# Patient Record
Sex: Male | Born: 1975 | Race: White | Hispanic: No | Marital: Married | Smoking: Never smoker
Health system: Southern US, Community
[De-identification: ages and names within clinical notes are randomized; demographics above are authoritative.]

## PROBLEM LIST (undated history)

## (undated) DIAGNOSIS — I10 Essential (primary) hypertension: Secondary | ICD-10-CM

## (undated) DIAGNOSIS — T7840XA Allergy, unspecified, initial encounter: Secondary | ICD-10-CM

## (undated) DIAGNOSIS — E119 Type 2 diabetes mellitus without complications: Secondary | ICD-10-CM

## (undated) HISTORY — PX: FRACTURE SURGERY: SHX138

## (undated) HISTORY — DX: Allergy, unspecified, initial encounter: T78.40XA

---

## 1995-05-19 HISTORY — PX: FACIAL RECONSTRUCTION SURGERY: SHX631

## 2007-04-27 ENCOUNTER — Emergency Department (HOSPITAL_COMMUNITY): Admission: EM | Admit: 2007-04-27 | Discharge: 2007-04-27 | Payer: Self-pay | Admitting: Emergency Medicine

## 2008-06-24 ENCOUNTER — Emergency Department (HOSPITAL_COMMUNITY): Admission: EM | Admit: 2008-06-24 | Discharge: 2008-06-24 | Payer: Self-pay | Admitting: Emergency Medicine

## 2008-08-06 ENCOUNTER — Encounter: Admission: RE | Admit: 2008-08-06 | Discharge: 2008-08-06 | Payer: Self-pay | Admitting: Otolaryngology

## 2010-09-02 LAB — CULTURE, BLOOD (ROUTINE X 2)
Culture: NO GROWTH
Culture: NO GROWTH

## 2010-09-02 LAB — POCT I-STAT, CHEM 8
BUN: 17 mg/dL (ref 6–23)
Calcium, Ion: 1.15 mmol/L (ref 1.12–1.32)
HCT: 51 % (ref 39.0–52.0)
Hemoglobin: 17.3 g/dL — ABNORMAL HIGH (ref 13.0–17.0)
Potassium: 3.9 mEq/L (ref 3.5–5.1)
Sodium: 141 mEq/L (ref 135–145)
TCO2: 25 mmol/L (ref 0–100)

## 2010-09-02 LAB — DIFFERENTIAL
Basophils Relative: 0 % (ref 0–1)
Eosinophils Absolute: 0 10*3/uL (ref 0.0–0.7)
Lymphs Abs: 1 10*3/uL (ref 0.7–4.0)
Monocytes Relative: 2 % — ABNORMAL LOW (ref 3–12)

## 2010-09-02 LAB — CBC
Hemoglobin: 16.3 g/dL (ref 13.0–17.0)
MCV: 89.7 fL (ref 78.0–100.0)
Platelets: 256 10*3/uL (ref 150–400)
WBC: 11.3 10*3/uL — ABNORMAL HIGH (ref 4.0–10.5)

## 2011-02-23 LAB — URINALYSIS, ROUTINE W REFLEX MICROSCOPIC
Nitrite: NEGATIVE
Specific Gravity, Urine: 1.025
Urobilinogen, UA: 0.2
pH: 7

## 2016-07-21 DIAGNOSIS — E669 Obesity, unspecified: Secondary | ICD-10-CM | POA: Insufficient documentation

## 2016-08-13 MED FILL — CEPHALEXIN 500 MG CAPSULE: 500 | 30 days supply | Qty: 60 | Fill #0

## 2016-08-13 MED FILL — CLINDAMYCIN PH 1% GEL: 1 | 20 days supply | Qty: 30 | Fill #0 | Status: TO

## 2018-06-28 DIAGNOSIS — L719 Rosacea, unspecified: Secondary | ICD-10-CM | POA: Insufficient documentation

## 2019-02-17 ENCOUNTER — Encounter (HOSPITAL_COMMUNITY): Payer: Self-pay | Admitting: Emergency Medicine

## 2019-02-17 ENCOUNTER — Emergency Department (HOSPITAL_COMMUNITY): Payer: 59

## 2019-02-17 ENCOUNTER — Emergency Department (HOSPITAL_COMMUNITY)
Admission: EM | Admit: 2019-02-17 | Discharge: 2019-02-17 | Disposition: A | Discharge status: Home / Self Care | Payer: 59 | Attending: Emergency Medicine | Admitting: Emergency Medicine

## 2019-02-17 DIAGNOSIS — I10 Essential (primary) hypertension: Secondary | ICD-10-CM | POA: Diagnosis not present

## 2019-02-17 DIAGNOSIS — M62830 Muscle spasm of back: Secondary | ICD-10-CM

## 2019-02-17 DIAGNOSIS — Z7984 Long term (current) use of oral hypoglycemic drugs: Secondary | ICD-10-CM | POA: Insufficient documentation

## 2019-02-17 DIAGNOSIS — Z79899 Other long term (current) drug therapy: Secondary | ICD-10-CM | POA: Diagnosis not present

## 2019-02-17 DIAGNOSIS — E119 Type 2 diabetes mellitus without complications: Secondary | ICD-10-CM | POA: Insufficient documentation

## 2019-02-17 DIAGNOSIS — M549 Dorsalgia, unspecified: Secondary | ICD-10-CM | POA: Diagnosis present

## 2019-02-17 HISTORY — DX: Essential (primary) hypertension: I10

## 2019-02-17 HISTORY — DX: Type 2 diabetes mellitus without complications: E11.9

## 2019-02-17 LAB — BASIC METABOLIC PANEL
Anion gap: 7 (ref 5–15)
BUN: 14 mg/dL (ref 6–20)
CO2: 26 mmol/L (ref 22–32)
Calcium: 8.6 mg/dL — ABNORMAL LOW (ref 8.9–10.3)
Chloride: 107 mmol/L (ref 98–111)
Creatinine, Ser: 0.91 mg/dL (ref 0.61–1.24)
GFR calc Af Amer: >60 mL/min (ref >60)
GFR calc non Af Amer: >60 mL/min (ref >60)
Glucose, Bld: 121 mg/dL — ABNORMAL HIGH (ref 70–99)
Potassium: 3.7 mmol/L (ref 3.5–5.1)
Sodium: 140 mmol/L (ref 135–145)

## 2019-02-17 LAB — CBC WITH DIFFERENTIAL/PLATELET
Abs Immature Granulocytes: 0.04 10*3/uL (ref 0.00–0.07)
Basophils Absolute: 0 10*3/uL (ref 0.0–0.1)
Basophils Relative: 0 %
Eosinophils Absolute: 0 10*3/uL (ref 0.0–0.5)
Eosinophils Relative: 0 %
HCT: 44.2 % (ref 39.0–52.0)
Hemoglobin: 14.4 g/dL (ref 13.0–17.0)
Immature Granulocytes: 1 %
Lymphocytes Relative: 14 %
Lymphs Abs: 1.1 10*3/uL (ref 0.7–4.0)
MCH: 29.8 pg (ref 26.0–34.0)
MCHC: 32.6 g/dL (ref 30.0–36.0)
MCV: 91.3 fL (ref 80.0–100.0)
Monocytes Absolute: 0.4 10*3/uL (ref 0.1–1.0)
Monocytes Relative: 5 %
Neutro Abs: 6.5 10*3/uL (ref 1.7–7.7)
Neutrophils Relative %: 80 %
Platelets: 199 10*3/uL (ref 150–400)
RBC: 4.84 MIL/uL (ref 4.22–5.81)
RDW: 12.6 % (ref 11.5–15.5)
WBC: 8.1 10*3/uL (ref 4.0–10.5)
nRBC: 0 % (ref 0.0–0.2)

## 2019-02-17 MED ORDER — IBUPROFEN 600 MG PO TABS: 600.0000 mg | ORAL_TABLET | Freq: Three times a day (TID) | ORAL | 0 refills | Status: DC | PRN

## 2019-02-17 MED ORDER — CYCLOBENZAPRINE HCL 10 MG PO TABS: 10.0000 mg | ORAL_TABLET | Freq: Two times a day (BID) | ORAL | 0 refills | Status: DC | PRN

## 2019-02-17 MED ORDER — METHYLPREDNISOLONE 4 MG PO TBPK: ORAL_TABLET | ORAL | 0 refills | Status: DC

## 2019-02-17 MED ORDER — DEXAMETHASONE SODIUM PHOSPHATE 10 MG/ML IJ SOLN
8.0000 mg | Freq: Once | INTRAMUSCULAR | Status: AC
  Administered 2019-02-17: 8 mg via INTRAVENOUS
  Filled 2019-02-17: qty 1

## 2019-02-17 MED ORDER — MORPHINE SULFATE (PF) 4 MG/ML IV SOLN
4.0000 mg | Freq: Once | INTRAVENOUS | Status: AC
  Administered 2019-02-17: 18:00:00 4 mg via INTRAVENOUS
  Filled 2019-02-17: qty 1

## 2019-02-17 NOTE — ED Notes (Signed)
Pt was verbalized discharge instructions. Pt had no further questions at this time. NAD. 

## 2019-02-17 NOTE — ED Provider Notes (Signed)
[facility]-EMERGENCY DEPT Provider Note   CSN: [id number] Arrival date & time: 02/17/19  1526     History   Chief Complaint Chief Complaint  Patient presents with  . Back Pain    HPI Christian Neal is a 43 y.o. male.     43 year old male with prior medical history as detailed below presents for evaluation of reported back pain.  Patient reports acute onset of left-sided low back discomfort over the last 24 hours.  Patient reports that as he was working yesterday he twisted his back.  At that time he had an acute onset of sharp lower back pain.  Patient denies other specific inciting event.  Patient denies numbness or weakness to his lower extremities.  He denies difficulty urinating or with his bowel movements.  He denies fever.  Patient denies significant history of prior back pain.  The history is provided by the patient and medical records.  Back Pain Location:  Lumbar spine Quality:  Stabbing Radiates to:  Does not radiate Pain severity:  Moderate Onset quality:  Sudden Duration:  1 day Timing:  Constant Progression:  Waxing and waning Chronicity:  New Relieved by:  Nothing Worsened by:  Nothing   Past Medical History:  Diagnosis Date  . Diabetes mellitus without complication (HCC)   . Hypertension     There are no active problems to display for this patient.         Home Medications    Prior to Admission medications   Medication Sig Start Date End Date Taking? Authorizing Provider  loratadine (CLARITIN) 10 MG tablet Take 10 mg by mouth daily.   Yes [provider]  losartan (COZAAR) 50 MG tablet Take 50 mg by mouth daily. 02/17/19  Yes [provider]  metFORMIN (GLUCOPHAGE) 500 MG tablet Take 500 mg by mouth at bedtime.  01/21/19  Yes [provider]  metroNIDAZOLE (METROCREAM) 0.75 % cream Apply 1 application topically daily as needed for rash.   Yes [provider]  Multiple Vitamins-Minerals  (CENTRUM SILVER 50+MEN PO) Take 1 tablet by mouth daily.   Yes [provider]  clindamycin (CLINDAGEL) 1 % gel Apply 1 application topically daily as needed for rash. 08/13/16   [provider]  cyclobenzaprine (FLEXERIL) 10 MG tablet Take 1 tablet (10 mg total) by mouth 2 (two) times daily as needed for muscle spasms. 02/17/19   Wynetta FinesMessick, Ladavion Savitz C, MD  ibuprofen (ADVIL) 600 MG tablet Take 1 tablet (600 mg total) by mouth every 8 (eight) hours as needed. 02/17/19   Wynetta FinesMessick, Laurita Peron C, MD  methylPREDNISolone (MEDROL DOSEPAK) 4 MG TBPK tablet Take medrol dosepack taper as instructed on packaging 02/17/19   Wynetta FinesMessick, Lya Holben C, MD    Family History No family history on file.  Social History Social History   Tobacco Use  . Smoking status: Not on file  Substance Use Topics  . Alcohol use: Never    Frequency: Never  . Drug use: Never     Allergies   Patient has no allergy information on record.   Review of Systems Review of Systems  Musculoskeletal: Positive for back pain.  All other systems reviewed and are negative.    Physical Exam Updated Vital Signs BP 128/82 (BP Location: Right Arm)   Pulse 67   Temp 98 F (36.7 C) (Oral)   Resp 18   SpO2 97%   Physical Exam Vitals signs and nursing note reviewed.  Constitutional:  General: He is not in acute distress.    Appearance: He is well-developed.  HENT:     Head: Normocephalic and atraumatic.  Eyes:     Conjunctiva/sclera: Conjunctivae normal.     Pupils: Pupils are equal, round, and reactive to light.  Neck:     Musculoskeletal: Normal range of motion and neck supple.  Cardiovascular:     Rate and Rhythm: Normal rate and regular rhythm.     Heart sounds: Normal heart sounds.  Pulmonary:     Effort: Pulmonary effort is normal. No respiratory distress.     Breath sounds: Normal breath sounds.  Abdominal:     General: There is no distension.     Palpations: Abdomen is soft.     Tenderness: There is no  abdominal tenderness.  Musculoskeletal: Normal range of motion.        General: No deformity.  Skin:    General: Skin is warm and dry.  Neurological:     General: No focal deficit present.     Mental Status: He is alert and oriented to person, place, and time.     Cranial Nerves: No cranial nerve deficit.     Sensory: No sensory deficit.     Motor: No weakness.     Coordination: Coordination normal.     Comments: Patient is reporting moderate lower lumbar back pain.  No loss of sensation in the lower extremities. Strength is 5 out of 5 in both lower extremities. SLR is negative bilaterally.        ED Treatments / Results  Labs (all labs ordered are listed, but only abnormal results are displayed) Labs Reviewed  BASIC METABOLIC PANEL - Abnormal; Notable for the following components:      Result Value   Glucose, Bld 121 (*)    Calcium 8.6 (*)    All other components within normal limits  CBC WITH DIFFERENTIAL/PLATELET    EKG None  Radiology No results found.  Procedures Procedures (including critical care time)  Medications Ordered in ED Medications  morphine 4 MG/ML injection 4 mg (4 mg Intravenous Given 02/17/19 1748)  dexamethasone (DECADRON) injection 8 mg (8 mg Intravenous Given 02/17/19 1750)     Initial Impression / Assessment and Plan / ED Course  I have reviewed the triage vital signs and the nursing notes.  Pertinent labs & imaging results that were available during my care of the patient were reviewed by me and considered in my medical decision making (see chart for details).        MDM  Screen complete  Christian Neal was evaluated in Emergency Department on 02/17/2019 for the symptoms described in the history of present illness. He was evaluated in the context of the global COVID-19 pandemic, which necessitated consideration that the patient might be at risk for infection with the SARS-CoV-2 virus that causes COVID-19. Institutional protocols and  algorithms that pertain to the evaluation of patients at risk for COVID-19 are in a state of rapid change based on information released by regulatory bodies including the CDC and federal and state organizations. These policies and algorithms were followed during the patient's care in the ED.  Patient is presenting for evaluation of reported back pain.  Exam does not demonstrate red flag findings such as paresthesia, weakness, or other significant pathology.  Plain films of the low back are without significant abnormality.  Following administration of pain medication and anti-inflammatory medication in the ED, the patient feels significantly improved.  He is  ambulating without difficulty.  He is able to urinate.  He now desires discharge.  The patient already has established follow-up with orthopedics.  He does understand the need for close follow-up.  Strict return precautions given and understood.  Final Clinical Impressions(s) / ED Diagnoses   Final diagnoses:  Muscle spasm of back    ED Discharge Orders         Ordered    methylPREDNISolone (MEDROL DOSEPAK) 4 MG TBPK tablet     02/17/19 2151    cyclobenzaprine (FLEXERIL) 10 MG tablet  2 times daily PRN     02/17/19 2151    ibuprofen (ADVIL) 600 MG tablet  Every 8 hours PRN     02/17/19 2151           Valarie Merino, MD 02/17/19 2156

## 2019-02-17 NOTE — Discharge Instructions (Signed)
Please return for any problem.  Follow-up with your regular care provider as instructed. °

## 2019-02-17 NOTE — ED Triage Notes (Signed)
Per EMS-states he was bending over yesterday and twisted his back-had an appointment with ortho but couldn't get to appointment due to increased pain and decreased mobility-100 mcg of Fentanyl given in route given in route

## 2019-02-17 NOTE — ED Notes (Signed)
Pt provided with a urinal. Pt is now able to sit up in the bed, stating the pain is tolerable. Pt is going to attempt to sit on the edge of the bed and provide a urine sample. Wife at bedside.

## 2020-02-12 MED FILL — PREVIDENT 0.2% RINSE: 0.2 | 31 days supply | Qty: 473 | Fill #0

## 2020-10-31 ENCOUNTER — Other Ambulatory Visit: Payer: Self-pay

## 2020-11-01 ENCOUNTER — Encounter: Payer: Self-pay | Admitting: Family Medicine

## 2020-11-01 ENCOUNTER — Ambulatory Visit (INDEPENDENT_AMBULATORY_CARE_PROVIDER_SITE_OTHER): Payer: 59 | Admitting: Family Medicine

## 2020-11-01 VITALS — BP 100/68 | HR 76 | Temp 97.1°F | Ht 76.25 in | Wt 270.6 lb

## 2020-11-01 DIAGNOSIS — Z23 Encounter for immunization: Secondary | ICD-10-CM

## 2020-11-01 DIAGNOSIS — E119 Type 2 diabetes mellitus without complications: Secondary | ICD-10-CM | POA: Diagnosis not present

## 2020-11-01 DIAGNOSIS — I10 Essential (primary) hypertension: Secondary | ICD-10-CM | POA: Insufficient documentation

## 2020-11-01 DIAGNOSIS — R7303 Prediabetes: Secondary | ICD-10-CM | POA: Insufficient documentation

## 2020-11-01 LAB — HEMOGLOBIN A1C: Hgb A1c MFr Bld: 6.3 % (ref 4.6–6.5)

## 2020-11-01 NOTE — Progress Notes (Signed)
Christian Neal is a 45 y.o. male  Chief Complaint  Patient presents with   Establish Care    Np here to follow up on DM and HTN    HPI: Christian Neal is a 45 y.o. male patient seen today to establish care with our office and for f/u on HTN and DM. Previous PCP with [facility].   For HTN, pt is taking losartan 100mg  daily, spironolactone. 25mg  daily. For DM, pt is taking metformin 500mg  daily.   Pt does not routinely check BS at home. Readings: n/a Hypo or hyperglycemic episodes: no  Last A1C in 07/2020 - 6.0  Diet: eats "relatively well" but can eat too much and does not like to waste food Exercise: on his feet at work, mows grass weekly, hikes occasionally  He had shingles last year on Rt thigh. Would like to get shingles vaccine but not today.  He would like pneumonia vaccine.   Pts parents both with colon polys. Pt has colo last at 40-41yo. Due in 5 years. [facility] - Dr.  Past Medical History:  Diagnosis Date   Diabetes mellitus without complication (HCC)    Hypertension     Past Surgical History:  Procedure Laterality Date   FACIAL COSMETIC SURGERY  1997    Social History   Socioeconomic History   Marital status: Married    Spouse name: Not on file   Number of children: Not on file   Years of education: Not on file   Highest education level: Not on file  Occupational History   Not on file  Tobacco Use   Smoking status: Never    Passive exposure: Never   Smokeless tobacco: Never  Substance and Sexual Activity   Alcohol use: Never   Drug use: Never   Sexual activity: Not on file  Other Topics Concern   Not on file  Social History Narrative   Not on file   Social Determinants of Health   Financial Resource Strain: Not on file  Food Insecurity: Not on file  Transportation Needs: Not on file  Physical Activity: Not on file  Stress: Not on file  Social Connections: Not on file  Intimate Partner Violence: Not on file    Family History  Problem  Relation Age of Onset   Hypertension Mother    Colon polyps Mother    Cancer Father 56       melanoma   Colon polyps Father    ADD / ADHD Maternal Uncle    Alzheimer's disease Paternal Grandmother      Immunization History  Administered Date(s) Administered   08/2020 (J&J) SARS-COV-2 Vaccination 08/26/2019    Outpatient Encounter Medications as of 11/01/2020  Medication Sig   clindamycin (CLINDAGEL) 1 % gel Apply 1 application topically daily as needed for rash.   ibuprofen (ADVIL) 600 MG tablet Take 1 tablet (600 mg total) by mouth every 8 (eight) hours as needed.   loratadine (CLARITIN) 10 MG tablet Take 10 mg by mouth daily.   losartan (COZAAR) 50 MG tablet Take 100 mg by mouth daily.   metFORMIN (GLUCOPHAGE) 500 MG tablet Take 500 mg by mouth at bedtime.    metroNIDAZOLE (METROCREAM) 0.75 % cream Apply 1 application topically daily as needed for rash.   Multiple Vitamins-Minerals (CENTRUM SILVER 50+MEN PO) Take 1 tablet by mouth daily.   spironolactone (ALDACTONE) 25 MG tablet Take by mouth.   zinc gluconate 50 MG tablet Take by mouth.   ascorbic acid (VITAMIN  C) 1000 MG tablet Take by mouth.   Cholecalciferol 125 MCG (5000 UT) TABS Take by mouth.   [DISCONTINUED] cyclobenzaprine (FLEXERIL) 10 MG tablet Take 1 tablet (10 mg total) by mouth 2 (two) times daily as needed for muscle spasms. (Patient not taking: Reported on 11/01/2020)   [DISCONTINUED] methylPREDNISolone (MEDROL DOSEPAK) 4 MG TBPK tablet Take medrol dosepack taper as instructed on packaging (Patient not taking: Reported on 11/01/2020)   No facility-administered encounter medications on file as of 11/01/2020.     ROS: Pertinent positives and negatives noted in HPI. Remainder of ROS non-contributory    Not on File  BP 100/68 (BP Location: Left Arm, Patient Position: Sitting, Cuff Size: Large)   Pulse 76   Temp (!) 97.1 F (36.2 C) (Temporal)   Ht 6' 4.25" (1.937 m)   Wt 270 lb 9.6 oz (122.7 kg)   SpO2 98%    BMI 32.72 kg/m   Physical Exam Constitutional:      General: He is not in acute distress.    Appearance: Normal appearance. He is not ill-appearing.  Cardiovascular:     Rate and Rhythm: Normal rate and regular rhythm.     Pulses: Normal pulses.     Heart sounds: Normal heart sounds.  Pulmonary:     Effort: Pulmonary effort is normal. No respiratory distress.     Breath sounds: Normal breath sounds. No wheezing.  Neurological:     Mental Status: He is alert and oriented to person, place, and time.  Psychiatric:        Mood and Affect: Mood normal.        Behavior: Behavior normal.     A/P:  1. Essential (primary) hypertension - controlled, at goal - cont spironolactone 25mg  daily, losartan 100mg  daily - low sodium diet, regular CV exercise   2. Diabetes mellitus without complication (HCC) - last A1C in 07/2020 = 6.0 - cont metformin 500mg  daily - cont losartan 100mg   - Hemoglobin A1c - f/u in 6 mo  3. Need for pneumococcal vaccination - Pneumococcal polysaccharide vaccine 23-valent greater than or equal to 2yo subcutaneous/IM    This visit occurred during the SARS-CoV-2 public health emergency.  Safety protocols were in place, including screening questions prior to the visit, additional usage of staff PPE, and extensive cleaning of exam room while observing appropriate contact time as indicated for disinfecting solutions.

## 2020-11-01 NOTE — Patient Instructions (Signed)
Schedule RN visit shingles vaccine

## 2020-11-28 ENCOUNTER — Other Ambulatory Visit: Payer: Self-pay

## 2020-11-28 ENCOUNTER — Ambulatory Visit (INDEPENDENT_AMBULATORY_CARE_PROVIDER_SITE_OTHER): Payer: 59

## 2020-11-28 DIAGNOSIS — Z23 Encounter for immunization: Secondary | ICD-10-CM

## 2020-11-28 NOTE — Progress Notes (Signed)
Per orders of Dr. Luana Shu, Pt is here for Shingrix vaccine, pt received 0.71mL IM injection in right deltoid, given by Willaim Bane, CMA. Pt tolerated injection well. Pt was given VIS and was instructed to return for second dose within 2-6 months. Pt voiced understanding.

## 2021-02-20 ENCOUNTER — Other Ambulatory Visit: Payer: Self-pay | Admitting: Family Medicine

## 2021-02-20 NOTE — Telephone Encounter (Signed)
Pt requesting refill for Losartan... toc sched with Dr Veto Kemps in Jan.

## 2021-02-20 NOTE — Telephone Encounter (Signed)
Refill request for:  Losartan 100 mg LR   ? LOV 11/01/20 (Dr Barron Alvine) FOV  05/21/20 (Dr Veto Kemps)  Please review and advise.  Thanks. Dm/cma

## 2021-02-21 MED ORDER — LOSARTAN POTASSIUM 50 MG PO TABS: 100.0000 mg | ORAL_TABLET | Freq: Every day | ORAL | 0 refills | Status: DC

## 2021-03-04 ENCOUNTER — Other Ambulatory Visit: Payer: Self-pay

## 2021-03-04 ENCOUNTER — Ambulatory Visit (INDEPENDENT_AMBULATORY_CARE_PROVIDER_SITE_OTHER): Payer: 59

## 2021-03-04 DIAGNOSIS — Z23 Encounter for immunization: Secondary | ICD-10-CM | POA: Diagnosis not present

## 2021-03-04 NOTE — Progress Notes (Signed)
After obtaining informed consent, the 2nd dose of the shingles immunization was given IM by Ambrose Pancoast W, in the left deltoid. Pt tolerated injection well. Pt to report any adverse reactions to me immediately.

## 2021-03-26 ENCOUNTER — Other Ambulatory Visit: Payer: Self-pay | Admitting: Family Medicine

## 2021-04-21 ENCOUNTER — Other Ambulatory Visit: Payer: Self-pay | Admitting: Family Medicine

## 2021-04-21 NOTE — Telephone Encounter (Signed)
Refill request for : Metformin 500 mg LR  hx provider,  LOV 11/01/20 (Dr C) FOV 05/21/21  Please review and advise.  Thanks.  Dm/cma

## 2021-04-23 ENCOUNTER — Other Ambulatory Visit: Payer: Self-pay | Admitting: Family Medicine

## 2021-05-21 ENCOUNTER — Ambulatory Visit (INDEPENDENT_AMBULATORY_CARE_PROVIDER_SITE_OTHER): Payer: 59 | Admitting: Family Medicine

## 2021-05-21 ENCOUNTER — Other Ambulatory Visit: Payer: Self-pay

## 2021-05-21 ENCOUNTER — Encounter: Payer: Self-pay | Admitting: Family Medicine

## 2021-05-21 VITALS — BP 132/70 | HR 85 | Temp 97.0°F | Ht 76.5 in | Wt 280.0 lb

## 2021-05-21 DIAGNOSIS — I10 Essential (primary) hypertension: Secondary | ICD-10-CM

## 2021-05-21 DIAGNOSIS — R7303 Prediabetes: Secondary | ICD-10-CM | POA: Diagnosis not present

## 2021-05-21 DIAGNOSIS — Z1159 Encounter for screening for other viral diseases: Secondary | ICD-10-CM

## 2021-05-21 DIAGNOSIS — Z13220 Encounter for screening for lipoid disorders: Secondary | ICD-10-CM | POA: Diagnosis not present

## 2021-05-21 DIAGNOSIS — T700XXD Otitic barotrauma, subsequent encounter: Secondary | ICD-10-CM | POA: Diagnosis not present

## 2021-05-21 DIAGNOSIS — J309 Allergic rhinitis, unspecified: Secondary | ICD-10-CM | POA: Insufficient documentation

## 2021-05-21 DIAGNOSIS — Z86010 Personal history of colonic polyps: Secondary | ICD-10-CM | POA: Insufficient documentation

## 2021-05-21 DIAGNOSIS — L732 Hidradenitis suppurativa: Secondary | ICD-10-CM | POA: Insufficient documentation

## 2021-05-21 DIAGNOSIS — Z114 Encounter for screening for human immunodeficiency virus [HIV]: Secondary | ICD-10-CM

## 2021-05-21 MED ORDER — SPIRONOLACTONE 50 MG PO TABS: 25.0000 mg | ORAL_TABLET | Freq: Once | ORAL | 3 refills | Status: DC
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
## 2021-05-28 ENCOUNTER — Other Ambulatory Visit (HOSPITAL_COMMUNITY): Payer: Self-pay

## 2021-05-28 MED ORDER — SPIRONOLACTONE 50 MG PO TABS
ORAL_TABLET | ORAL | 0 refills | Status: DC
  Filled 2021-06-23 – 2021-06-24 (×2): qty 30, 30d supply, fill #0

## 2021-05-28 MED ORDER — SODIUM FLUORIDE 1.1 % DT PSTE
PASTE | DENTAL | 3 refills | Status: DC
  Filled 2021-05-29: qty 100, 30d supply, fill #0

## 2021-05-29 ENCOUNTER — Other Ambulatory Visit (HOSPITAL_COMMUNITY): Payer: Self-pay

## 2021-05-29 MED FILL — Metformin HCl Tab 500 MG: ORAL | 31 days supply | Qty: 31 | Fill #0 | Status: AC

## 2021-06-23 ENCOUNTER — Other Ambulatory Visit (HOSPITAL_COMMUNITY): Payer: Self-pay

## 2021-06-23 MED FILL — Metformin HCl Tab 500 MG: ORAL | 29 days supply | Qty: 29 | Fill #1 | Status: AC

## 2021-06-23 MED FILL — Losartan Potassium Tab 50 MG: ORAL | 15 days supply | Qty: 30 | Fill #0 | Status: AC

## 2021-06-24 ENCOUNTER — Other Ambulatory Visit (HOSPITAL_COMMUNITY): Payer: Self-pay

## 2021-07-11 DIAGNOSIS — H9042 Sensorineural hearing loss, unilateral, left ear, with unrestricted hearing on the contralateral side: Secondary | ICD-10-CM | POA: Insufficient documentation

## 2021-07-11 DIAGNOSIS — H833X2 Noise effects on left inner ear: Secondary | ICD-10-CM | POA: Insufficient documentation

## 2021-07-15 ENCOUNTER — Other Ambulatory Visit: Payer: Self-pay | Admitting: Family Medicine

## 2021-07-15 ENCOUNTER — Other Ambulatory Visit (HOSPITAL_COMMUNITY): Payer: Self-pay

## 2021-07-15 MED ORDER — METFORMIN HCL 500 MG PO TABS
ORAL_TABLET | ORAL | 1 refills | Status: DC
  Filled 2021-07-15: qty 30, 30d supply, fill #0
  Filled 2021-08-18: qty 30, 30d supply, fill #1
  Filled 2021-09-22: qty 30, 30d supply, fill #2
  Filled 2021-10-18: qty 30, 30d supply, fill #3
  Filled 2021-11-16: qty 30, 30d supply, fill #4
  Filled 2021-12-29: qty 30, 30d supply, fill #5

## 2021-07-15 MED ORDER — LOSARTAN POTASSIUM 50 MG PO TABS
ORAL_TABLET | ORAL | 1 refills | Status: DC
  Filled 2021-07-15: qty 60, 30d supply, fill #0
  Filled 2021-08-18: qty 60, 30d supply, fill #1

## 2021-07-16 ENCOUNTER — Other Ambulatory Visit: Payer: Self-pay

## 2021-07-16 ENCOUNTER — Ambulatory Visit (HOSPITAL_COMMUNITY)
Admission: EM | Admit: 2021-07-16 | Discharge: 2021-07-16 | Disposition: A | Discharge status: Home / Self Care | Payer: 59 | Attending: Nurse Practitioner | Admitting: Nurse Practitioner

## 2021-07-16 ENCOUNTER — Encounter (HOSPITAL_COMMUNITY): Payer: Self-pay

## 2021-07-16 ENCOUNTER — Other Ambulatory Visit (HOSPITAL_COMMUNITY): Payer: Self-pay

## 2021-07-16 ENCOUNTER — Ambulatory Visit (INDEPENDENT_AMBULATORY_CARE_PROVIDER_SITE_OTHER): Payer: 59

## 2021-07-16 DIAGNOSIS — J069 Acute upper respiratory infection, unspecified: Secondary | ICD-10-CM

## 2021-07-16 DIAGNOSIS — R062 Wheezing: Secondary | ICD-10-CM

## 2021-07-16 DIAGNOSIS — R059 Cough, unspecified: Secondary | ICD-10-CM

## 2021-07-16 MED ORDER — ALBUTEROL SULFATE HFA 108 (90 BASE) MCG/ACT IN AERS
1.0000 | INHALATION_SPRAY | Freq: Four times a day (QID) | RESPIRATORY_TRACT | 0 refills | Status: DC | PRN
  Filled 2021-07-16: qty 8.5, 30d supply, fill #0

## 2021-07-16 NOTE — ED Provider Notes (Signed)
[facility]    CSN: [id number] Arrival date & time: 07/16/21  2633      History   Chief Complaint Chief Complaint  Patient presents with   Cough    HPI Christian Neal is a 46 y.o. male.   Patient reports about 2 weeks ago, he had some stomach upset and looser bowel movements.  He reports his wife and daughter are sick at home with pneumonia and bronchitis.  Patient reports he has been struggling with cough/allergies for the past few days.  He denies fevers, bodyaches, chills, shortness of breath, wheezing, chest pain, chest tightness, sinus pressure, headache, nausea, vomiting, or diarrhea, change in appetite, and fatigue.  He does endorse occasional cough, congestion, swollen glands, and woke up this morning with right ear pain.  He takes loratadine on a regular basis for allergies and has tried Mucinex with some relief of symptoms.     Past Medical History:  Diagnosis Date   Diabetes mellitus without complication (HCC)    Hypertension     Patient Active Problem List   Diagnosis Date Noted   Hidradenitis suppurativa 05/21/2021   Allergic rhinitis 05/21/2021   History of colon polyps 05/21/2021   Essential (primary) hypertension 11/01/2020   Prediabetes 11/01/2020   Rosacea 06/28/2018   Obesity (BMI 30-39.9) 07/21/2016    Past Surgical History:  Procedure Laterality Date   FACIAL RECONSTRUCTION SURGERY  1997       Home Medications    Prior to Admission medications   Medication Sig Start Date End Date Taking? Authorizing Provider  albuterol (VENTOLIN HFA) 108 (90 Base) MCG/ACT inhaler Inhale 1-2 puffs into the lungs every 6 (six) hours as needed for wheezing or shortness of breath. 07/16/21  Yes Cathlean Marseilles A, NP  ascorbic acid (VITAMIN C) 1000 MG tablet Take by mouth.    [provider]  Cholecalciferol 125 MCG (5000 UT) TABS Take by mouth.    [provider]  clindamycin (CLINDAGEL) 1 % gel Apply 1 application topically daily as  needed for rash. 08/13/16   [provider]  ibuprofen (ADVIL) 600 MG tablet Take 1 tablet (600 mg total) by mouth every 8 (eight) hours as needed. 02/17/19   Wynetta Fines, MD  loratadine (CLARITIN) 10 MG tablet Take 10 mg by mouth daily.    [provider]  losartan (COZAAR) 50 MG tablet TAKE 2 TABLETS BY MOUTH DAILY 07/15/21   Loyola Mast, MD  metFORMIN (GLUCOPHAGE) 500 MG tablet TAKE 1 TABLET BY MOUTH DAILY WITH BREAKFAST 07/15/21   Loyola Mast, MD  metroNIDAZOLE (METROCREAM) 0.75 % cream Apply 1 application topically daily as needed for rash.    [provider]  Multiple Vitamins-Minerals (CENTRUM SILVER 50+MEN PO) Take 1 tablet by mouth daily.    [provider]  PREVIDENT 5000 SENSITIVE 1.1-5 % GEL Place onto teeth. 02/23/21   [provider]  Sodium Fluoride 1.1 % PSTE Brush teeth twice daily for 2 minutes and spit out excess. Do not swish, eat or drink for 30 minutes after. 02/23/21     spironolactone (ALDACTONE) 50 MG tablet Take 0.5 tablets (25 mg total) by mouth once for 1 dose. 05/21/21 05/21/21  Loyola Mast, MD  spironolactone (ALDACTONE) 50 MG tablet Take 1 tablet by mouth daily. 04/23/21   Loyola Mast, MD  zinc gluconate 50 MG tablet Take by mouth.    [provider]    Family History Family History  Problem Relation Age  of Onset   Hypertension Mother    Colon polyps Mother    Cancer Father 64       melanoma   Colon polyps Father    Cancer Maternal Uncle        Lung   Alzheimer's disease Paternal Grandmother     Social History Social History   Tobacco Use   Smoking status: Never    Passive exposure: Never   Smokeless tobacco: Never  Vaping Use   Vaping Use: Never used  Substance Use Topics   Alcohol use: Yes    Comment: socially   Drug use: Never     Allergies   Patient has no known allergies.   Review of Systems Review of Systems Per HPI  Physical Exam Triage Vital Signs ED Triage  Vitals [07/16/21 1050]  Enc Vitals Group     BP 107/63     Pulse      Resp 18     Temp 97.9 F (36.6 C)     Temp Source Oral     SpO2 97 %     Weight      Height      Head Circumference      Peak Flow      Pain Score 0     Pain Loc      Pain Edu?      Excl. in GC?    No data found.  Updated Vital Signs BP 107/63 (BP Location: Right Arm)    Temp 97.9 F (36.6 C) (Oral)    Resp 18    SpO2 97%   Visual Acuity Right Eye Distance:   Left Eye Distance:   Bilateral Distance:    Right Eye Near:   Left Eye Near:    Bilateral Near:     Physical Exam Vitals and nursing note reviewed.  Constitutional:      General: He is not in acute distress.    Appearance: Normal appearance. He is not toxic-appearing.  HENT:     Head: Normocephalic and atraumatic.     Right Ear: Tympanic membrane, ear canal and external ear normal.     Left Ear: Tympanic membrane, ear canal and external ear normal.     Nose: Nose normal. No congestion or rhinorrhea.     Mouth/Throat:     Mouth: Mucous membranes are moist.     Pharynx: Oropharynx is clear. Posterior oropharyngeal erythema present. No oropharyngeal exudate.  Eyes:     General: No scleral icterus.    Extraocular Movements: Extraocular movements intact.  Cardiovascular:     Rate and Rhythm: Normal rate and regular rhythm.  Pulmonary:     Effort: Pulmonary effort is normal. No respiratory distress.     Breath sounds: No stridor. Wheezing present. No rhonchi or rales.  Lymphadenopathy:     Cervical: Cervical adenopathy present.  Skin:    General: Skin is warm and dry.     Capillary Refill: Capillary refill takes less than 2 seconds.     Coloration: Skin is not jaundiced or pale.     Findings: No erythema or rash.  Neurological:     Mental Status: He is alert and oriented to person, place, and time.     Motor: No weakness.     Gait: Gait normal.  Psychiatric:        Mood and Affect: Mood normal.        Behavior: Behavior normal.         Thought Content: Thought  content normal.        Judgment: Judgment normal.     UC Treatments / Results  Labs (all labs ordered are listed, but only abnormal results are displayed) Labs Reviewed - No data to display  EKG   Radiology DG Chest 2 View  Result Date: 07/16/2021 CLINICAL DATA:  Cough and wheezing. EXAM: CHEST - 2 VIEW COMPARISON:  CT chest 02/23/2011 FINDINGS: The heart size and mediastinal contours are within normal limits. Both lungs are clear. The visualized skeletal structures are unremarkable. IMPRESSION: No active cardiopulmonary disease. Electronically Signed   By: Signa Kell M.D.   On: 07/16/2021 11:49    Procedures Procedures (including critical care time)  Medications Ordered in UC Medications - No data to display  Initial Impression / Assessment and Plan / UC Course  I have reviewed the triage vital signs and the nursing notes.  Pertinent labs & imaging results that were available during my care of the patient were reviewed by me and considered in my medical decision making (see chart for details).    Given expiratory wheezing, check chest x-ray.  Reassured patient that symptoms and exam findings are most consistent with a viral upper respiratory infection and explained lack of efficacy of antibiotics against viruses.  Discussed expected course and features suggestive of secondary bacterial infection.  Continue supportive care. Increase fluid intake with water or electrolyte solution like pedialyte. Encouraged acetaminophen as needed for fever/pain. Encouraged salt water gargling, chloraseptic spray and throat lozenges. Encouraged OTC guaifenesin. Encouraged saline sinus flushes and/or neti with humidified air.  Start albuterol inhaler for wheezing and follow up with PCP if symptoms persist.  Note given for work.  Final Clinical Impressions(s) / UC Diagnoses   Final diagnoses:  Expiratory wheezing  Viral URI with cough     Discharge Instructions       Your chest x-ray today did not show any acute process.  Please start albuterol inhaler every 4-6 hours as needed for wheezing, shortness of breath, or cough.  Follow up with your primary care provider if your symptoms continue to persist more than 10 days.     ED Prescriptions     Medication Sig Dispense Auth. Provider   albuterol (VENTOLIN HFA) 108 (90 Base) MCG/ACT inhaler Inhale 1-2 puffs into the lungs every 6 (six) hours as needed for wheezing or shortness of breath. 6.7 g Valentino Nose, NP      PDMP not reviewed this encounter.   Valentino Nose, NP 07/16/21 1201

## 2021-07-16 NOTE — Discharge Instructions (Addendum)
Your chest x-ray today did not show any acute process.  Please start albuterol inhaler every 4-6 hours as needed for wheezing, shortness of breath, or cough.  Follow up with your primary care provider if your symptoms continue to persist more than 10 days. ?

## 2021-07-16 NOTE — ED Triage Notes (Signed)
Pt c/o cold sx's x2wks intermittent and now having rt ear pain and more congestion. States his wife has PNA and daughter has bronchitis.  ?

## 2021-07-21 ENCOUNTER — Other Ambulatory Visit: Payer: Self-pay

## 2021-07-21 ENCOUNTER — Other Ambulatory Visit (INDEPENDENT_AMBULATORY_CARE_PROVIDER_SITE_OTHER): Payer: 59

## 2021-07-21 ENCOUNTER — Encounter: Payer: Self-pay | Admitting: Family Medicine

## 2021-07-21 DIAGNOSIS — E785 Hyperlipidemia, unspecified: Secondary | ICD-10-CM | POA: Insufficient documentation

## 2021-07-21 DIAGNOSIS — R7303 Prediabetes: Secondary | ICD-10-CM | POA: Diagnosis not present

## 2021-07-21 DIAGNOSIS — Z13220 Encounter for screening for lipoid disorders: Secondary | ICD-10-CM | POA: Diagnosis not present

## 2021-07-21 DIAGNOSIS — Z114 Encounter for screening for human immunodeficiency virus [HIV]: Secondary | ICD-10-CM

## 2021-07-21 DIAGNOSIS — Z1159 Encounter for screening for other viral diseases: Secondary | ICD-10-CM

## 2021-07-21 LAB — URINALYSIS, ROUTINE W REFLEX MICROSCOPIC
Bilirubin Urine: NEGATIVE
Hgb urine dipstick: NEGATIVE
Ketones, ur: NEGATIVE
Leukocytes,Ua: NEGATIVE
Nitrite: NEGATIVE
RBC / HPF: NONE SEEN (ref 0–?)
Specific Gravity, Urine: 1.025 (ref 1.000–1.030)
Total Protein, Urine: NEGATIVE
Urine Glucose: NEGATIVE
Urobilinogen, UA: 0.2 (ref 0.0–1.0)
WBC, UA: NONE SEEN (ref 0–?)
pH: 5.5 (ref 5.0–8.0)

## 2021-07-21 LAB — MICROALBUMIN / CREATININE URINE RATIO
Creatinine,U: 164.2 mg/dL
Microalb Creat Ratio: 0.4 mg/g (ref 0.0–30.0)
Microalb, Ur: <0.7 mg/dL (ref 0.0–1.9)

## 2021-07-21 LAB — LIPID PANEL
Cholesterol: 120 mg/dL (ref 0–200)
HDL: 23.3 mg/dL — ABNORMAL LOW (ref >39.00)
NonHDL: 97.02
Total CHOL/HDL Ratio: 5
Triglycerides: 202 mg/dL — ABNORMAL HIGH (ref 0.0–149.0)
VLDL: 40.4 mg/dL — ABNORMAL HIGH (ref 0.0–40.0)

## 2021-07-21 LAB — BASIC METABOLIC PANEL
BUN: 19 mg/dL (ref 6–23)
CO2: 25 mEq/L (ref 19–32)
Calcium: 9.2 mg/dL (ref 8.4–10.5)
Chloride: 103 mEq/L (ref 96–112)
Creatinine, Ser: 0.98 mg/dL (ref 0.40–1.50)
GFR: 92.8 mL/min (ref >60.00)
Glucose, Bld: 128 mg/dL — ABNORMAL HIGH (ref 70–99)
Potassium: 4.1 mEq/L (ref 3.5–5.1)
Sodium: 138 mEq/L (ref 135–145)

## 2021-07-21 LAB — LDL CHOLESTEROL, DIRECT: Direct LDL: 67 mg/dL

## 2021-07-21 LAB — HEMOGLOBIN A1C: Hgb A1c MFr Bld: 6.7 % — ABNORMAL HIGH (ref 4.6–6.5)

## 2021-07-22 LAB — HIV ANTIBODY (ROUTINE TESTING W REFLEX): HIV 1&2 Ab, 4th Generation: NONREACTIVE

## 2021-07-22 LAB — HCV AB W REFLEX TO QUANT PCR: HCV Ab: NONREACTIVE

## 2021-07-22 LAB — HCV INTERPRETATION

## 2021-08-18 ENCOUNTER — Other Ambulatory Visit (HOSPITAL_COMMUNITY): Payer: Self-pay

## 2021-08-18 ENCOUNTER — Other Ambulatory Visit: Payer: Self-pay | Admitting: Family Medicine

## 2021-08-18 DIAGNOSIS — I10 Essential (primary) hypertension: Secondary | ICD-10-CM

## 2021-08-19 ENCOUNTER — Other Ambulatory Visit (HOSPITAL_COMMUNITY): Payer: Self-pay

## 2021-08-19 MED ORDER — SPIRONOLACTONE 50 MG PO TABS
25.0000 mg | ORAL_TABLET | Freq: Once | ORAL | 0 refills | Status: DC
  Filled 2021-08-19: qty 15, 30d supply, fill #0
  Filled 2021-09-22: qty 15, 30d supply, fill #1
  Filled 2021-10-18: qty 15, 30d supply, fill #2

## 2021-08-26 ENCOUNTER — Encounter: Payer: Self-pay | Admitting: Family Medicine

## 2021-08-26 ENCOUNTER — Ambulatory Visit (INDEPENDENT_AMBULATORY_CARE_PROVIDER_SITE_OTHER): Payer: 59 | Admitting: Family Medicine

## 2021-08-26 VITALS — BP 130/82 | HR 65 | Temp 97.6°F | Ht 75.5 in | Wt 269.2 lb

## 2021-08-26 DIAGNOSIS — Z0000 Encounter for general adult medical examination without abnormal findings: Secondary | ICD-10-CM | POA: Diagnosis not present

## 2021-08-26 DIAGNOSIS — E119 Type 2 diabetes mellitus without complications: Secondary | ICD-10-CM | POA: Diagnosis not present

## 2021-08-26 DIAGNOSIS — E669 Obesity, unspecified: Secondary | ICD-10-CM | POA: Diagnosis not present

## 2021-08-26 DIAGNOSIS — I10 Essential (primary) hypertension: Secondary | ICD-10-CM | POA: Diagnosis not present

## 2021-08-26 DIAGNOSIS — E785 Hyperlipidemia, unspecified: Secondary | ICD-10-CM

## 2021-08-26 MED ORDER — LOSARTAN POTASSIUM 50 MG PO TABS: ORAL_TABLET | ORAL | 3 refills | Status: DC
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
## 2021-09-19 ENCOUNTER — Encounter: Payer: Self-pay | Admitting: Family Medicine

## 2021-09-23 ENCOUNTER — Other Ambulatory Visit (HOSPITAL_COMMUNITY): Payer: Self-pay

## 2021-10-03 ENCOUNTER — Other Ambulatory Visit (HOSPITAL_COMMUNITY): Payer: Self-pay

## 2021-10-03 LAB — HM DIABETES EYE EXAM

## 2021-10-03 MED ORDER — NEOMYCIN-POLYMYXIN-DEXAMETH 0.1 % OP OINT
TOPICAL_OINTMENT | OPHTHALMIC | 1 refills | Status: DC
  Filled 2021-10-03: qty 3.5, 7d supply, fill #0

## 2021-10-15 ENCOUNTER — Other Ambulatory Visit (HOSPITAL_COMMUNITY): Payer: Self-pay

## 2021-10-15 MED ORDER — OFLOXACIN 0.3 % OP SOLN
OPHTHALMIC | 0 refills | Status: DC
  Filled 2021-10-15: qty 5, 7d supply, fill #0

## 2021-10-16 ENCOUNTER — Other Ambulatory Visit (HOSPITAL_COMMUNITY): Payer: Self-pay

## 2021-10-17 ENCOUNTER — Other Ambulatory Visit (HOSPITAL_COMMUNITY): Payer: Self-pay

## 2021-10-18 ENCOUNTER — Other Ambulatory Visit (HOSPITAL_COMMUNITY): Payer: Self-pay

## 2021-10-20 ENCOUNTER — Other Ambulatory Visit (HOSPITAL_COMMUNITY): Payer: Self-pay

## 2021-10-20 ENCOUNTER — Other Ambulatory Visit: Payer: Self-pay | Admitting: Family Medicine

## 2021-10-20 MED ORDER — LOSARTAN POTASSIUM 50 MG PO TABS
ORAL_TABLET | ORAL | 3 refills | Status: DC
  Filled 2021-10-20: qty 60, 30d supply, fill #0
  Filled 2021-11-16: qty 60, 30d supply, fill #1
  Filled 2022-02-13: qty 60, 30d supply, fill #2
  Filled 2022-04-14: qty 60, 30d supply, fill #3
  Filled 2022-05-20: qty 60, 30d supply, fill #4
  Filled 2022-08-16 – 2022-08-24 (×2): qty 60, 30d supply, fill #5

## 2021-10-21 ENCOUNTER — Other Ambulatory Visit (HOSPITAL_COMMUNITY): Payer: Self-pay

## 2021-11-16 ENCOUNTER — Other Ambulatory Visit: Payer: Self-pay | Admitting: Family Medicine

## 2021-11-16 DIAGNOSIS — I10 Essential (primary) hypertension: Secondary | ICD-10-CM

## 2021-11-17 ENCOUNTER — Other Ambulatory Visit (HOSPITAL_COMMUNITY): Payer: Self-pay

## 2021-11-17 MED ORDER — SPIRONOLACTONE 50 MG PO TABS
25.0000 mg | ORAL_TABLET | Freq: Once | ORAL | 0 refills | Status: DC
  Filled 2021-11-17: qty 15, 30d supply, fill #0
  Filled 2021-12-29: qty 15, 30d supply, fill #1
  Filled 2022-01-26: qty 15, 30d supply, fill #2

## 2021-12-04 ENCOUNTER — Other Ambulatory Visit: Payer: Self-pay | Admitting: Family Medicine

## 2021-12-06 ENCOUNTER — Other Ambulatory Visit: Payer: Self-pay | Admitting: Family Medicine

## 2021-12-08 ENCOUNTER — Ambulatory Visit (INDEPENDENT_AMBULATORY_CARE_PROVIDER_SITE_OTHER): Payer: 59 | Admitting: Family Medicine

## 2021-12-08 ENCOUNTER — Encounter: Payer: Self-pay | Admitting: Family Medicine

## 2021-12-08 ENCOUNTER — Other Ambulatory Visit (HOSPITAL_COMMUNITY): Payer: Self-pay

## 2021-12-08 VITALS — BP 118/76 | HR 71 | Temp 97.6°F | Ht 75.5 in | Wt 258.6 lb

## 2021-12-08 DIAGNOSIS — E669 Obesity, unspecified: Secondary | ICD-10-CM

## 2021-12-08 DIAGNOSIS — I10 Essential (primary) hypertension: Secondary | ICD-10-CM | POA: Diagnosis not present

## 2021-12-08 DIAGNOSIS — E785 Hyperlipidemia, unspecified: Secondary | ICD-10-CM | POA: Diagnosis not present

## 2021-12-08 DIAGNOSIS — E119 Type 2 diabetes mellitus without complications: Secondary | ICD-10-CM | POA: Diagnosis not present

## 2021-12-08 DIAGNOSIS — M7711 Lateral epicondylitis, right elbow: Secondary | ICD-10-CM

## 2021-12-08 LAB — GLUCOSE, RANDOM: Glucose, Bld: 119 mg/dL — ABNORMAL HIGH (ref 70–99)

## 2021-12-08 LAB — HEMOGLOBIN A1C: Hgb A1c MFr Bld: 6.5 % (ref 4.6–6.5)

## 2021-12-08 MED ORDER — PREVIDENT 5000 SENSITIVE 1.1-5 % DT GEL
DENTAL | 11 refills | Status: DC
  Filled 2021-12-08: qty 100, 30d supply, fill #0
  Filled 2022-04-30: qty 100, 30d supply, fill #1
  Filled 2022-10-12: qty 100, 30d supply, fill #2

## 2021-12-08 NOTE — Progress Notes (Signed)
[facility] [address] [place] [address] Dept: [phone] Dept Fax: [phone]  Chronic Care Office Visit  Subjective:    Patient ID: Christian Neal, male    DOB: [birth year], 46 y.o..   MRN: [id number]  Chief Complaint  Patient presents with   Follow-up    3 month f/u.  Fasting today.  C/o Rt elbow pain x 2 months. Has used pain cream.      History of Present Illness:  Patient is in today for reassessment of chronic medical issues.  Mr. Christian Neal was diagnosed with Type 2 diabetes earlier this year. He is currently managed on metformin 500 mg daily. He is checking his blood sugars at home. He notes his fasting blood sugar is often around 120. He would prefer if this was lower.   Mr. Christian Neal has a history of hypertension. He is managed on losartan 50 mg daily and spironolactone 25 mg daily.  Mr. Christian Neal continues to work at weight loss. he had set a goal to be at 250 lb.  by this appointment, so feels disappointed that he didn't make that. He has had to back off on his weight lifting, as he is having right elbow pain. He is unsure if this is a tendon or a ligament problem. The pain developed gradually. He does note certain motions seem to flare this up.   Past Medical History: Patient Active Problem List   Diagnosis Date Noted   Dyslipidemia 07/21/2021   Sensorineural hearing loss (SNHL) of left ear with unrestricted hearing of right ear 07/11/2021   Hidradenitis suppurativa 05/21/2021   Allergic rhinitis 05/21/2021   History of colon polyps 05/21/2021   Essential (primary) hypertension 11/01/2020   Type 2 diabetes mellitus (HCC) 11/01/2020   Rosacea 06/28/2018   Obesity (BMI 30-39.9) 07/21/2016   Past Surgical History:  Procedure Laterality Date   FACIAL RECONSTRUCTION SURGERY  1997   Family History  Problem Relation Age of Onset   Hypertension Mother    Colon polyps Mother    Cancer Father 76       melanoma   Colon  polyps Father    Cancer Maternal Uncle        Lung   Alzheimer's disease Paternal Grandmother    Outpatient Medications Prior to Visit  Medication Sig Dispense Refill   albuterol (VENTOLIN HFA) 108 (90 Base) MCG/ACT inhaler Inhale 1-2 puffs into the lungs every 6 (six) hours as needed for wheezing or shortness of breath. 8.5 g 0   ascorbic acid (VITAMIN C) 1000 MG tablet Take by mouth.     Cholecalciferol 125 MCG (5000 UT) TABS Take by mouth.     clindamycin (CLINDAGEL) 1 % gel Apply 1 application topically daily as needed for rash.     glucose blood (ACCU-CHEK AVIVA PLUS) test strip 1 each by Other route as needed for other. Use as instructed     ibuprofen (ADVIL) 600 MG tablet Take 1 tablet (600 mg total) by mouth every 8 (eight) hours as needed. 30 tablet 0   loratadine (CLARITIN) 10 MG tablet Take 10 mg by mouth daily.     losartan (COZAAR) 50 MG tablet TAKE 2 TABLETS BY MOUTH DAILY (Patient taking differently: Take 50 mg by mouth daily.) 90 tablet 3   metFORMIN (GLUCOPHAGE) 500 MG tablet TAKE 1 TABLET BY MOUTH DAILY WITH BREAKFAST 90 tablet 1   metroNIDAZOLE (METROCREAM) 0.75 % cream Apply 1 application topically daily as needed for rash.  Multiple Vitamins-Minerals (CENTRUM SILVER 50+MEN PO) Take 1 tablet by mouth daily.     PREVIDENT 5000 SENSITIVE 1.1-5 % GEL Place onto teeth.     spironolactone (ALDACTONE) 50 MG tablet Take 1/2 tablet by mouth once for 1 dose as directed. 45 tablet 0   zinc gluconate 50 MG tablet Take by mouth.     losartan (COZAAR) 50 MG tablet Takes 1 tablet daily 90 tablet 3   neomycin-polymyxin-dexameth (MAXITROL) 0.1 % OINT Apply to left eye lid 2 times a day 3.5 g 1   ofloxacin (OCUFLOX) 0.3 % ophthalmic solution Place 1 drop into the left eye 3 times a day for 1 week 5 mL 0   No facility-administered medications prior to visit.   No Known Allergies    Objective:   Today's Vitals   12/08/21 0808  BP: 118/76  Pulse: 71  Temp: 97.6 F (36.4 C)   TempSrc: Temporal  SpO2: 97%  Weight: 258 lb 9.6 oz (117.3 kg)  Height: 6' 3.5" (1.918 m)   Body mass index is 31.9 kg/m.   General: Well developed, well nourished. No acute distress. Extremities: Full ROM. Pain on palpation over the right lateral epicondyle. resisted pronation is painful, but   with good strength. Psych: Alert and oriented. Normal mood and affect.  Health Maintenance Due  Topic Date Due   OPHTHALMOLOGY EXAM  Never done   COVID-19 Vaccine (2 - [employer] risk series) 09/23/2019   COLONOSCOPY (Pts 45-28yrs Insurance coverage will need to be confirmed)  Never done     Assessment & Plan:   1. Type 2 diabetes mellitus without complication, without long-term current use of insulin (HCC) Due for repeat A1c. Has been at goal. Continue metformin 500 mg daily.  - Glucose, random - Hemoglobin A1c  2. Essential (primary) hypertension BP is at goal. Continue losartan 50 mg daily and spironolactone 25 mg daily.  3. Obesity (BMI 30-39.9) Weight is down 22 lbs. since Jan. Continue dietary and exercise approaches.  4. Dyslipidemia Will reassess at next appointment.  5. Lateral epicondylitis of right elbow Agree with reducing weight lifting. I recommended he consider using a tennis elbow strap or support. I recommended he take naproxen 220 mg, two tablets twice a day for 1 week. If not improving, we will consider a steroid injection.   Return in about 3 months (around 03/10/2022) for Reassessment.   Loyola Mast, MD

## 2021-12-08 NOTE — Patient Instructions (Signed)
Tennis Elbow  Tennis elbow (lateral epicondylitis) is inflammation of tendons in your outer forearm, near your elbow. Tendons are tissues that connect muscle to bone. When you have tennis elbow, inflammation affects the tendons that you use to bend your wrist and move your hand up. Inflammation occurs in the lower part of the upper arm bone (humerus), where the tendons connect to the bone (lateral epicondyle). Tennis elbow often affects people who play tennis, but anyone may get the condition from repeatedly extending the wrist or turning the forearm. What are the causes? This condition is usually caused by repeatedly extending the wrist, turning the forearm, and using the hands. It can result from sports or work that requires repetitive forearm movements. In some cases, it may be caused by a sudden injury. What increases the risk? You are more likely to develop tennis elbow if you play tennis or another racket sport. You also have a higher risk if you frequently use your hands for work. Besides people who play tennis, others at greater risk include: People who use computers. Construction workers. People who work in factories. Musicians. Cooks. Cashiers. What are the signs or symptoms? Symptoms of this condition include: Pain and tenderness in the forearm and the outer part of the elbow. Pain may be felt only when using the arm, or it may be there all the time. A burning feeling that starts in the elbow and spreads down the forearm. A weak grip in the hand. How is this diagnosed? This condition is diagnosed based on your symptoms, your medical history, and a physical exam. You may also have X-rays or an MRI to: Confirm the diagnosis. Look for other issues. Check for tears in the ligaments, muscles, or tendons. How is this treated? Resting and icing your arm is often the first treatment. Your health care provider may also recommend: Medicines to reduce pain and inflammation. These may be in  the form of a pill, topical gels, or shots of a steroid medicine (cortisone). An elbow strap to reduce stress on the area. Physical therapy. This may include massage or exercises or both. An elbow brace to restrict the movements that cause symptoms. If these treatments do not help relieve your symptoms, your health care provider may recommend surgery to remove damaged muscle and reattach healthy muscle to bone. Follow these instructions at home: If you have a brace or strap: Wear the brace or strap as told by your health care provider. Remove it only as told by your health care provider. Check the skin around the brace or strap every day. Tell your health care provider about any concerns. Loosen the brace if your fingers tingle, become numb, or turn cold and blue. Keep the brace clean. If the brace or strap is not waterproof: Do not let it get wet. Cover it with a watertight covering when you take a bath or a shower. Managing pain, stiffness, and swelling  If directed, put ice on the injured area. To do this: If you have a removable brace or strap, remove it as told by your health care provider. Put ice in a plastic bag. Place a towel between your skin and the bag. Leave the ice on for 20 minutes, 2-3 times a day. Remove the ice if your skin turns bright red. This is very important. If you cannot feel pain, heat, or cold, you have a greater risk of damage to the area. Move your fingers often to reduce stiffness and swelling. Activity Rest your elbow   and wrist and avoid activities that cause symptoms as told by your health care provider. Do physical therapy exercises as told by your health care provider. If you lift an object, lift it with your palm facing up. This reduces stress on your elbow. Lifestyle If your tennis elbow is caused by sports, check your equipment and make sure that: You use it correctly. It is good match for you. If your tennis elbow is caused by work or computer  use, take frequent breaks to stretch your arm. Talk with your employer about ways to manage your condition at work. General instructions Take over-the-counter and prescription medicines only as told by your health care provider. Do not use any products that contain nicotine or tobacco. These products include cigarettes, chewing tobacco, and vaping devices, such as e-cigarettes. If you need help quitting, ask your health care provider. Keep all follow-up visits. This is important. How is this prevented? Before and after activity: Warm up and stretch before being active. Cool down and stretch after being active. Give your body time to rest between periods of activity. During activity: Make sure to use equipment that fits you. If you play tennis, put power in your stroke with your lower body. Avoid using your arm only. Maintain physical fitness, including: Strength. Flexibility. Endurance. Do exercises to strengthen the forearm muscles. Contact a health care provider if: You have pain that gets worse or does not get better with treatment. You have numbness or weakness in your forearm, hand, or fingers. Get help right away if: Your pain is severe. You cannot move your wrist. Summary Tennis elbow (lateral epicondylitis) is inflammation of tendons in your outer forearm, near your elbow. Common symptoms include pain and tenderness in your forearm and the outer part of your elbow. This condition is usually caused by repeatedly extending your wrist, turning your forearm, and using your hands. The first treatment is often resting and icing your arm to relieve symptoms. Further treatment may include taking medicine, getting physical therapy, wearing a brace or strap, or having surgery. This information is not intended to replace advice given to you by your health care provider. Make sure you discuss any questions you have with your health care provider. Document Revised: 11/14/2019 Document  Reviewed: 11/14/2019 Elsevier Patient Education  2023 [employer] Inc.  

## 2021-12-09 ENCOUNTER — Other Ambulatory Visit (HOSPITAL_COMMUNITY): Payer: Self-pay

## 2021-12-11 ENCOUNTER — Other Ambulatory Visit (HOSPITAL_COMMUNITY): Payer: Self-pay

## 2021-12-29 ENCOUNTER — Other Ambulatory Visit (HOSPITAL_COMMUNITY): Payer: Self-pay

## 2022-01-26 ENCOUNTER — Other Ambulatory Visit: Payer: Self-pay | Admitting: Family Medicine

## 2022-01-27 ENCOUNTER — Other Ambulatory Visit (HOSPITAL_COMMUNITY): Payer: Self-pay

## 2022-01-27 MED ORDER — METFORMIN HCL 500 MG PO TABS
ORAL_TABLET | ORAL | 3 refills | Status: DC
  Filled 2022-01-27: qty 30, 30d supply, fill #0
  Filled 2022-02-13 – 2022-02-19 (×2): qty 30, 30d supply, fill #1
  Filled 2022-03-23: qty 30, 30d supply, fill #2
  Filled 2022-04-14 – 2022-04-20 (×4): qty 30, 30d supply, fill #3
  Filled 2022-05-20: qty 30, 30d supply, fill #4
  Filled 2022-06-20: qty 30, 30d supply, fill #5
  Filled 2022-07-20: qty 30, 30d supply, fill #6
  Filled 2022-08-16 – 2022-08-24 (×2): qty 30, 30d supply, fill #7
  Filled 2022-09-27: qty 30, 30d supply, fill #8
  Filled 2022-10-12 – 2022-10-26 (×2): qty 30, 30d supply, fill #9
  Filled 2022-11-26 (×2): qty 30, 30d supply, fill #10
  Filled 2022-12-27: qty 30, 30d supply, fill #11

## 2022-02-13 ENCOUNTER — Other Ambulatory Visit: Payer: Self-pay | Admitting: Family Medicine

## 2022-02-13 DIAGNOSIS — I10 Essential (primary) hypertension: Secondary | ICD-10-CM

## 2022-02-14 ENCOUNTER — Other Ambulatory Visit (HOSPITAL_COMMUNITY): Payer: Self-pay

## 2022-02-15 MED ORDER — SPIRONOLACTONE 50 MG PO TABS
25.0000 mg | ORAL_TABLET | Freq: Every day | ORAL | 3 refills | Status: DC
  Filled 2022-02-15 – 2022-02-19 (×2): qty 15, 30d supply, fill #0
  Filled 2022-03-23: qty 15, 30d supply, fill #1
  Filled 2022-04-14 – 2022-04-20 (×4): qty 15, 30d supply, fill #2
  Filled 2022-05-20: qty 15, 30d supply, fill #3
  Filled 2022-06-20: qty 15, 30d supply, fill #4
  Filled 2022-07-20: qty 15, 30d supply, fill #5
  Filled 2022-08-16 – 2022-08-24 (×2): qty 15, 30d supply, fill #6

## 2022-02-16 ENCOUNTER — Other Ambulatory Visit (HOSPITAL_COMMUNITY): Payer: Self-pay

## 2022-02-17 ENCOUNTER — Other Ambulatory Visit (HOSPITAL_COMMUNITY): Payer: Self-pay

## 2022-02-18 ENCOUNTER — Other Ambulatory Visit (HOSPITAL_COMMUNITY): Payer: Self-pay

## 2022-02-19 ENCOUNTER — Other Ambulatory Visit (HOSPITAL_COMMUNITY): Payer: Self-pay

## 2022-03-24 ENCOUNTER — Other Ambulatory Visit (HOSPITAL_COMMUNITY): Payer: Self-pay

## 2022-04-14 ENCOUNTER — Other Ambulatory Visit (HOSPITAL_COMMUNITY): Payer: Self-pay

## 2022-04-20 ENCOUNTER — Other Ambulatory Visit (HOSPITAL_COMMUNITY): Payer: Self-pay

## 2022-04-20 MED ORDER — PEG 3350-KCL-NA BICARB-NACL 420 G PO SOLR
ORAL | 0 refills | Status: DC
  Filled 2022-04-20: qty 4000, 1d supply, fill #0

## 2022-04-30 LAB — HM COLONOSCOPY

## 2022-05-12 ENCOUNTER — Encounter: Payer: Self-pay | Admitting: Family Medicine

## 2022-06-20 ENCOUNTER — Encounter: Payer: Self-pay | Admitting: Family Medicine

## 2022-06-24 ENCOUNTER — Ambulatory Visit (INDEPENDENT_AMBULATORY_CARE_PROVIDER_SITE_OTHER): Payer: 59 | Admitting: Family Medicine

## 2022-06-24 ENCOUNTER — Encounter: Payer: Self-pay | Admitting: Family Medicine

## 2022-06-24 VITALS — BP 118/82 | HR 58 | Temp 98.0°F | Ht 75.5 in | Wt 263.2 lb

## 2022-06-24 DIAGNOSIS — M545 Low back pain, unspecified: Secondary | ICD-10-CM

## 2022-06-24 DIAGNOSIS — I10 Essential (primary) hypertension: Secondary | ICD-10-CM | POA: Diagnosis not present

## 2022-06-24 DIAGNOSIS — S90122A Contusion of left lesser toe(s) without damage to nail, initial encounter: Secondary | ICD-10-CM

## 2022-06-24 DIAGNOSIS — E119 Type 2 diabetes mellitus without complications: Secondary | ICD-10-CM

## 2022-06-24 DIAGNOSIS — G8929 Other chronic pain: Secondary | ICD-10-CM

## 2022-06-24 LAB — HEMOGLOBIN A1C: Hgb A1c MFr Bld: 6.4 % (ref 4.6–6.5)

## 2022-06-24 NOTE — Assessment & Plan Note (Signed)
Due for a check on his A1c. Continue metformin 500 mg daily.

## 2022-06-24 NOTE — Assessment & Plan Note (Signed)
Blood pressure at goal. Continue losartan 50 mg daily and spironolactone 50 mg 1/2 tab daily.

## 2022-06-24 NOTE — Progress Notes (Signed)
[facility] [address] [place] [address] Dept: [phone] Dept Fax: [phone]  Office Visit  Subjective:    Patient ID: Christian Neal, male    DOB: 09/07/1975, 47 y.o..   MRN: [id number]  Chief Complaint  Patient presents with   Foot Pain    C/o having LT foot/toe pain in 2-3 weeks.    Also having low back pain    History of Present Illness:  Patient is in today for evaluation of his left 2nd toe. He notes around [place], he went out to wash his car. He does not recall anything traumatic occurring. However, that night, he was having pain in the left 2nd toe. He noted an area that to him looked like a blood blister. he has been watching this and it continues to be present. He is not having any redness or pain associated with this. his wife was concerned in light of his history of diabetes.  Christian Neal was diagnosed with Type 2 diabetes in 2023. He is currently managed on metformin 500 mg daily. He is checking his blood sugars at home. He notes his fasting blood sugar ranges from 120-150. He would prefer if this was lower.    Christian Neal has a history of hypertension. He is managed on losartan 50 mg daily and spironolactone 50 mg 1/2 tab daily.  Christian Neal has a past history of lower back pain. He wonders about the best approach to preventing worsening issues with this over time. He has been working on weight loss. He has gone to the gym in the past and used trainers, but is not actively engaged currently.  Past Medical History: Patient Active Problem List   Diagnosis Date Noted   Dyslipidemia 07/21/2021   Sensorineural hearing loss (SNHL) of left ear with unrestricted hearing of right ear 07/11/2021   Hidradenitis suppurativa 05/21/2021   Allergic rhinitis 05/21/2021   History of colon polyps 05/21/2021   Essential (primary) hypertension 11/01/2020   Type 2 diabetes mellitus ([place]) 11/01/2020   Rosacea 06/28/2018    Obesity (BMI 30-39.9) 07/21/2016   Past Surgical History:  Procedure Laterality Date   FACIAL RECONSTRUCTION SURGERY  1997   Family History  Problem Relation Age of Onset   Hypertension Mother    Colon polyps Mother    Cancer Father 41       melanoma   Colon polyps Father    Cancer Maternal Uncle        Lung   Alzheimer's disease Paternal Grandmother    Outpatient Medications Prior to Visit  Medication Sig Dispense Refill   Cholecalciferol 125 MCG (5000 UT) TABS Take by mouth.     ascorbic acid (VITAMIN C) 1000 MG tablet Take by mouth.     clindamycin (CLINDAGEL) 1 % gel Apply 1 application topically daily as needed for rash.     glucose blood (ACCU-CHEK AVIVA PLUS) test strip 1 each by Other route as needed for other. Use as instructed     losartan (COZAAR) 50 MG tablet TAKE 2 TABLETS BY MOUTH DAILY (Patient taking differently: Take 50 mg by mouth daily.) 90 tablet 3   metFORMIN (GLUCOPHAGE) 500 MG tablet TAKE 1 TABLET BY MOUTH DAILY WITH BREAKFAST 90 tablet 3   metroNIDAZOLE (METROCREAM) 0.75 % cream Apply 1 application topically daily as needed for rash.     Multiple Vitamins-Minerals (CENTRUM SILVER 50+MEN PO) Take 1 tablet by mouth daily.     PREVIDENT 5000 SENSITIVE  1.1-5 % GEL Brush teeth with a thin ribbon of paste for at least 2 minutes once daily. 100 mL 11   spironolactone (ALDACTONE) 50 MG tablet Take 0.5 tablets (25 mg total) by mouth daily as directed 45 tablet 3   zinc gluconate 50 MG tablet Take by mouth.     albuterol (VENTOLIN HFA) 108 (90 Base) MCG/ACT inhaler Inhale 1-2 puffs into the lungs every 6 (six) hours as needed for wheezing or shortness of breath. (Patient not taking: Reported on 06/24/2022) 8.5 g 0   ibuprofen (ADVIL) 600 MG tablet Take 1 tablet (600 mg total) by mouth every 8 (eight) hours as needed. (Patient not taking: Reported on 06/24/2022) 30 tablet 0   loratadine (CLARITIN) 10 MG tablet Take 10 mg by mouth daily. (Patient not taking: Reported on  06/24/2022)     polyethylene glycol-electrolytes (NULYTELY) 420 g solution Take as directed per package instructions 4000 mL 0   No facility-administered medications prior to visit.   No Known Allergies    Objective:   Today's Vitals   06/24/22 1259  BP: 118/82  Pulse: (!) 58  Temp: 98 F (36.7 C)  TempSrc: Temporal  SpO2: 98%  Weight: 263 lb 3.2 oz (119.4 kg)  Height: 6' 3.5" (1.918 m)   Body mass index is 32.46 kg/m.   General: Well developed, well nourished. No acute distress. Foot: There is a black, round area of hematoma under the skin of the plantar surface of the left 2nd toe. No   surrounding erythema. No joint pain. The hematoma is sharply demarcated appears to be close tot he skin   surface. Psych: Alert and oriented. Normal mood and affect.  Health Maintenance Due  Topic Date Due   HEMOGLOBIN A1C  06/10/2022   Diabetic kidney evaluation - eGFR measurement  07/22/2022   Diabetic kidney evaluation - Urine ACR  07/22/2022     Assessment & Plan:   Problem List Items Addressed This Visit       Cardiovascular and Mediastinum   Essential (primary) hypertension    Blood pressure at goal. Continue losartan 50 mg daily and spironolactone 50 mg 1/2 tab daily.        Endocrine   Type 2 diabetes mellitus ([place])    Due for a check on his A1c. Continue metformin 500 mg daily.      Relevant Orders   Hemoglobin A1c (Completed)     Other   Chronic midline low back pain without sciatica    I support ongoing efforts at weight loss. I will refer him to PT for trainign on back and core exercises to help prevent worsening back issues.      Relevant Orders   Ambulatory referral to Physical Therapy   Other Visit Diagnoses     Hematoma of toe of left foot, initial encounter    -  Primary   Appears to be resolving. No need for other intervention.       Return for As scheduled.   Haydee Salter, MD

## 2022-06-24 NOTE — Assessment & Plan Note (Signed)
I support ongoing efforts at weight loss. I will refer him to PT for trainign on back and core exercises to help prevent worsening back issues.

## 2022-07-07 NOTE — Therapy (Unsigned)
OUTPATIENT PHYSICAL THERAPY THORACOLUMBAR EVALUATION   Patient Name: Christian Neal MRN: [id number] DOB:1975-10-31, 47 y.o., male Today's Date: 07/08/2022  END OF SESSION:  PT End of Session - 07/08/22 1712     Visit Number 1    Date for PT Re-Evaluation 09/02/22    PT Start Time 1630    PT Stop Time 1710    PT Time Calculation (min) 40 min    Activity Tolerance Patient tolerated treatment well    Behavior During Therapy [facility] for tasks assessed/performed             Past Medical History:  Diagnosis Date   Allergy    Diabetes mellitus without complication ([place])    Hypertension    Past Surgical History:  Procedure Laterality Date   FACIAL RECONSTRUCTION SURGERY  1997   Patient Active Problem List   Diagnosis Date Noted   Chronic midline low back pain without sciatica 06/24/2022   Dyslipidemia 07/21/2021   Sensorineural hearing loss (SNHL) of left ear with unrestricted hearing of right ear 07/11/2021   Hidradenitis suppurativa 05/21/2021   Allergic rhinitis 05/21/2021   History of colon polyps 05/21/2021   Essential (primary) hypertension 11/01/2020   Type 2 diabetes mellitus ([place]) 11/01/2020   Rosacea 06/28/2018   Obesity (BMI 30-39.9) 07/21/2016    PCP: Haydee Salter, MD  REFERRING PROVIDER: Haydee Salter, MD  REFERRING DIAG:   M54.50,G89.29 (ICD-10-CM) - Chronic midline low back pain without sciatica    Rationale for Evaluation and Treatment: Rehabilitation  THERAPY DIAG:  Other lack of coordination  Muscle weakness (generalized)  Chronic midline low back pain without sciatica  ONSET DATE: 06/24/22  SUBJECTIVE:                                                                                                                                                                                           SUBJECTIVE STATEMENT: Patient reports that he has LBP. He is concerned about the pain moving forward. He reports that the last disc is injured after an  incident 2 years ago. He wants to learn how to protect his back from injury or further damage.  PERTINENT HISTORY:  Per referring Physician: Mr. Hassinger has a past history of lower back pain. He wonders about the best approach to preventing worsening issues with this over time. He has been working on weight loss. He has gone to the gym in the past and used trainers, but is not actively engaged currently.  DM2, L 2nd toe pain and blister.  PAIN:  Are you having pain? Yes: NPRS scale: 1/10 Pain location: middle of the low back  Pain description: aching Aggravating factors: Nothing specific, fearful to lift weights. Relieving factors: Pain usually passes on its own.  PRECAUTIONS: None  WEIGHT BEARING RESTRICTIONS: No  FALLS:  Has patient fallen in last 6 months? 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  PLOF: Independent  PATIENT GOALS: Wants to learn how to move correctly and get stronger in his core to protect his back from further injury. Some weight lifting would be nice. Wants to lose a little more weight.  NEXT MD VISIT: 08/31/22  OBJECTIVE:   DIAGNOSTIC FINDINGS:  02/17/19-lumbar Xrays (-) for acute osseous abnormality. FINDINGS: Five non rib-bearing lumbar type vertebra. Lumbar alignment within normal limits. The vertebral body heights are grossly maintained. Disc spaces are maintained. Mild posterior facet degenerative change.  SCREENING FOR RED FLAGS: Bowel or bladder incontinence: No Spinal tumors: No Cauda equina syndrome: No Compression fracture: No Abdominal aneurysm: No  COGNITION: Overall cognitive status: Within functional limits for tasks  assessed     SENSATION: WFL  MUSCLE LENGTH: Hamstrings: Right 70 deg; Left 58 deg Thomas test:WNL B  POSTURE: decreased lumbar lordosis and decreased thoracic kyphosis  PALPATION: Mildly TTP along midline at L4  LUMBAR ROM:   AROM eval  Flexion WNL  Extension WNL, feels tight  Right lateral flexion WNL, feels tight  Left lateral flexion WNL, feels tight  Right rotation 60%  Left rotation 80%   (Blank rows = not tested)  LOWER EXTREMITY ROM:  WNL, tight at end range in hip flexion and IR B.  LOWER EXTREMITY MMT:  5/5 LE, trunk appears less stable, Decreased coordinated stabilization.   LUMBAR SPECIAL TESTS:  Straight leg raise test: Negative, Slump test: Negative, and FABER test: Negative  GAIT: Distance walked: In clinic distances Assistive device utilized: None Level of assistance: Complete Independence Comments: No abnormalities noted. Patient reports he used to run and walk for several miles, but unable to at this time.  TODAY'S TREATMENT:                                                                                                                              DATE:  07/08/22  SKTC, DKTC-Did not feel stretch Figure 4 stretch-1 x 20 sec B Supine glut stretch-1 x 20 sec B  PATIENT EDUCATION:  Education details: POC, HEP Person educated: Patient Education method: Consulting civil engineer, Demonstration, and Handouts Education comprehension: verbalized understanding and returned demonstration  HOME EXERCISE PROGRAM: [id number]  ASSESSMENT:  CLINICAL IMPRESSION: Patient is a 47 y.o. who was seen today for physical therapy evaluation and treatment for chronic low back pain. He reports that he injured his back several years ago. His pain is currently minimal, but he  is concerned about the long term prognosis for pain and disability. He wants to learn how to strengthen his trunk and wants to be able to return to increased exercise for continuing his weight loss efforts and  prevention against back pain. He admits that he has a hard time grading his effort and has injured himself in the past due to lifting too much. His MMT strength is very good, but his coordinated control and trunk stability are poor. He will benefit from developing an HEP which addresses some tightness in hips and lower trunk as well as improve stability in his trunk to decrease injury risk.  OBJECTIVE IMPAIRMENTS: decreased activity tolerance, decreased coordination, decreased endurance, difficulty walking, decreased ROM, decreased strength, increased fascial restrictions, increased muscle spasms, impaired flexibility, improper body mechanics, postural dysfunction, and pain.   ACTIVITY LIMITATIONS: carrying, lifting, bending, squatting, and locomotion level  PARTICIPATION LIMITATIONS: cleaning, community activity, and occupation  PERSONAL FACTORS: Past/current experiences are also affecting patient's functional outcome.   REHAB POTENTIAL: Good  CLINICAL DECISION MAKING: Stable/uncomplicated  EVALUATION COMPLEXITY: Low   GOALS: Goals reviewed with patient? Yes  SHORT TERM GOALS: Target date: 07/21/22  I with initial HEP Baseline: Goal status: INITIAL   LONG TERM GOALS: Target date: 09/02/22  I with final HEP Baseline:  Goal status: INITIAL  2.  Patient will demonstrate correct plank position and verbalize understanding of how long to hold the position.  Baseline:  Goal status: INITIAL  3.  Patient will maintain pelvic tilt while performing dead bugs x 5 each side. Baseline:  Goal status: INITIAL  4.  Patient will verbalize understanding of quality over quantity with exercise, including how to identify when to stop any particular activity. Baseline:  Goal status: INITIAL  5.  Patient will verbalize appropriate progression of exercise to promote maximal gains in strength and cardio vascular performance with minimal injury risk. Baseline:  Goal status: INITIAL  PLAN:  PT  FREQUENCY: 1x/week  PT DURATION: 8 weeks  PLANNED INTERVENTIONS: Therapeutic exercises, Therapeutic activity, Neuromuscular re-education, Balance training, Gait training, Patient/Family education, Self Care, Joint mobilization, Dry Needling, Cryotherapy, Moist heat, Ionotophoresis 68m/ml Dexamethasone, and Manual therapy.  PLAN FOR NEXT SESSION: Update HEP with core stabilization/strength activities.  SMarcelina Morel DPT 07/08/2022, 5:34 PM

## 2022-07-08 ENCOUNTER — Ambulatory Visit: Payer: 59 | Attending: Family Medicine | Admitting: Physical Therapy

## 2022-07-08 ENCOUNTER — Encounter: Payer: Self-pay | Admitting: Physical Therapy

## 2022-07-08 DIAGNOSIS — G8929 Other chronic pain: Secondary | ICD-10-CM | POA: Insufficient documentation

## 2022-07-08 DIAGNOSIS — M545 Low back pain, unspecified: Secondary | ICD-10-CM | POA: Diagnosis present

## 2022-07-08 DIAGNOSIS — M6281 Muscle weakness (generalized): Secondary | ICD-10-CM | POA: Insufficient documentation

## 2022-07-08 DIAGNOSIS — R278 Other lack of coordination: Secondary | ICD-10-CM | POA: Diagnosis present

## 2022-07-13 ENCOUNTER — Ambulatory Visit: Payer: 59 | Admitting: Physical Therapy

## 2022-07-13 ENCOUNTER — Encounter: Payer: Self-pay | Admitting: Physical Therapy

## 2022-07-13 DIAGNOSIS — M545 Low back pain, unspecified: Secondary | ICD-10-CM

## 2022-07-13 DIAGNOSIS — M6281 Muscle weakness (generalized): Secondary | ICD-10-CM

## 2022-07-13 DIAGNOSIS — R278 Other lack of coordination: Secondary | ICD-10-CM | POA: Diagnosis not present

## 2022-07-13 NOTE — Therapy (Signed)
OUTPATIENT PHYSICAL THERAPY THORACOLUMBAR EVALUATION   Patient Name: Christian Neal MRN: [id number] DOB:12/18/1975, 47 y.o., male Today's Date: 07/13/2022  END OF SESSION:  PT End of Session - 07/13/22 1726     Visit Number 2    Date for PT Re-Evaluation 09/02/22    PT Start Time 1718    PT Stop Time 1758    PT Time Calculation (min) 40 min    Activity Tolerance Patient tolerated treatment well    Behavior During Therapy [facility] for tasks assessed/performed              Past Medical History:  Diagnosis Date   Allergy    Diabetes mellitus without complication ([place])    Hypertension    Past Surgical History:  Procedure Laterality Date   [place]   Patient Active Problem List   Diagnosis Date Noted   Chronic midline low back pain without sciatica 06/24/2022   Dyslipidemia 07/21/2021   Sensorineural hearing loss (SNHL) of left ear with unrestricted hearing of right ear 07/11/2021   Hidradenitis suppurativa 05/21/2021   Allergic rhinitis 05/21/2021   History of colon polyps 05/21/2021   Essential (primary) hypertension 11/01/2020   Type 2 diabetes mellitus ([place]) 11/01/2020   Rosacea 06/28/2018   Obesity (BMI 30-39.9) 07/21/2016    PCP: Haydee Salter, MD  REFERRING PROVIDER: Haydee Salter, MD  REFERRING DIAG:   M54.50,G89.29 (ICD-10-CM) - Chronic midline low back pain without sciatica    Rationale for Evaluation and Treatment: Rehabilitation  THERAPY DIAG:  No diagnosis found.  ONSET DATE: 06/24/22  SUBJECTIVE:                                                                                                                                                                                           SUBJECTIVE STATEMENT: Patient reports that he has been performing the stretches about every other day. He feels like these will help. His B knees are a little sore and achy.  PERTINENT HISTORY:  Per referring Physician: Mr. Ridenbaugh has a past  history of lower back pain. He wonders about the best approach to preventing worsening issues with this over time. He has been working on weight loss. He has gone to the gym in the past and used trainers, but is not actively engaged currently.  DM2, L 2nd toe pain and blister.  PAIN:  Are you having pain? Yes: NPRS scale: 1/10 Pain location: middle of the low back Pain description: aching Aggravating factors: Nothing specific, fearful to lift weights. Relieving factors: Pain usually passes on its own.  PRECAUTIONS: None  WEIGHT BEARING RESTRICTIONS:  No  FALLS:  Has patient fallen in last 6 months? 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  PLOF: Independent  PATIENT GOALS: Wants to learn how to move correctly and get stronger in his core to protect his back from further injury. Some weight lifting would be nice. Wants to lose a little more weight.  NEXT MD VISIT: 08/31/22  OBJECTIVE:   DIAGNOSTIC FINDINGS:  02/17/19-lumbar Xrays (-) for acute osseous abnormality. FINDINGS: Five non rib-bearing lumbar type vertebra. Lumbar alignment within normal limits. The vertebral body heights are grossly maintained. Disc spaces are maintained. Mild posterior facet degenerative change.  SCREENING FOR RED FLAGS: Bowel or bladder incontinence: No Spinal tumors: No Cauda equina syndrome: No Compression fracture: No Abdominal aneurysm: No  COGNITION: Overall cognitive status: Within functional limits for tasks assessed     SENSATION: WFL  MUSCLE LENGTH: Hamstrings: Right 70 deg; Left 58 deg Thomas test:WNL B  POSTURE: decreased lumbar lordosis and decreased thoracic  kyphosis  PALPATION: Mildly TTP along midline at L4  LUMBAR ROM:   AROM eval  Flexion WNL  Extension WNL, feels tight  Right lateral flexion WNL, feels tight  Left lateral flexion WNL, feels tight  Right rotation 60%  Left rotation 80%   (Blank rows = not tested)  LOWER EXTREMITY ROM:  WNL, tight at end range in hip flexion and IR B.  LOWER EXTREMITY MMT:  5/5 LE, trunk appears less stable, Decreased coordinated stabilization.   LUMBAR SPECIAL TESTS:  Straight leg raise test: Negative, Slump test: Negative, and FABER test: Negative  GAIT: Distance walked: In clinic distances Assistive device utilized: None Level of assistance: Complete Independence Comments: No abnormalities noted. Patient reports he used to run and walk for several miles, but unable to at this time.  TODAY'S TREATMENT:                                                                                                                              DATE:  07/13/22 NuStep L5 x 6 Supine pelvic tilt, bridge, clamshell, March with pelvic tilt, 10 each Standing B side step against G tband at knees, knee flexion with G band at ankles, heel raises. 10 reps each Seated knee flex with 35#, 2 x 10 reps Seated knee ext with 20#, 2 x 10 reps  07/08/22  SKTC, DKTC-Did not feel stretch Figure 4 stretch-1 x 20 sec B Supine glut stretch-1 x 20 sec B  PATIENT EDUCATION:  Education details: POC, HEP Person educated: Patient Education method: Explanation, Demonstration, and Handouts Education comprehension: verbalized understanding and returned demonstration  HOME EXERCISE PROGRAM: [email or web address]  ASSESSMENT:  CLINICAL IMPRESSION: Patient reports no new issues. He is  concerned about his knees as they are sometimes achy as well as his back. Re-assured that strengtehning will benefit both. Updated HEP for more comprehensive strengthening. He has not really worked his HS or lateral hips and is also somewhat unstable in his  abs.  OBJECTIVE IMPAIRMENTS: decreased activity tolerance, decreased coordination, decreased endurance, difficulty walking, decreased ROM, decreased strength, increased fascial restrictions, increased muscle spasms, impaired flexibility, improper body mechanics, postural dysfunction, and pain.   ACTIVITY LIMITATIONS: carrying, lifting, bending, squatting, and locomotion level  PARTICIPATION LIMITATIONS: cleaning, community activity, and occupation  PERSONAL FACTORS: Past/current experiences are also affecting patient's functional outcome.   REHAB POTENTIAL: Good  CLINICAL DECISION MAKING: Stable/uncomplicated  EVALUATION COMPLEXITY: Low   GOALS: Goals reviewed with patient? Yes  SHORT TERM GOALS: Target date: 07/21/22  I with initial HEP Baseline: Goal status: INITIAL   LONG TERM GOALS: Target date: 09/02/22  I with final HEP Baseline:  Goal status: INITIAL  2.  Patient will demonstrate correct plank position and verbalize understanding of how long to hold the position.  Baseline:  Goal status: INITIAL  3.  Patient will maintain pelvic tilt while performing dead bugs x 5 each side. Baseline:  Goal status: INITIAL  4.  Patient will verbalize understanding of quality over quantity with exercise, including how to identify when to stop any particular activity. Baseline:  Goal status: INITIAL  5.  Patient will verbalize appropriate progression of exercise to promote maximal gains in strength and cardio vascular performance with minimal injury risk. Baseline:  Goal status: INITIAL  PLAN:  PT FREQUENCY: 1x/week  PT DURATION: 8 weeks  PLANNED INTERVENTIONS: Therapeutic exercises, Therapeutic activity, Neuromuscular re-education, Balance training, Gait training, Patient/Family education, Self Care, Joint mobilization, Dry Needling, Cryotherapy, Moist heat, Ionotophoresis '4mg'$ /ml Dexamethasone, and Manual therapy.  PLAN FOR NEXT SESSION: Update HEP with core  stabilization/strength activities.  Marcelina Morel, DPT 07/13/2022, 6:01 PM

## 2022-07-20 ENCOUNTER — Ambulatory Visit: Payer: 59 | Attending: Family Medicine

## 2022-07-20 DIAGNOSIS — M6281 Muscle weakness (generalized): Secondary | ICD-10-CM | POA: Diagnosis present

## 2022-07-20 DIAGNOSIS — M545 Low back pain, unspecified: Secondary | ICD-10-CM | POA: Insufficient documentation

## 2022-07-20 DIAGNOSIS — R278 Other lack of coordination: Secondary | ICD-10-CM | POA: Diagnosis present

## 2022-07-20 DIAGNOSIS — G8929 Other chronic pain: Secondary | ICD-10-CM | POA: Insufficient documentation

## 2022-07-20 NOTE — Therapy (Signed)
OUTPATIENT PHYSICAL THERAPY THORACOLUMBAR EVALUATION   Patient Name: Christian Neal MRN: [id number] DOB:02/10/1976, 47 y.o., male Today's Date: 07/20/2022  END OF SESSION:  PT End of Session - 07/20/22 1636     Visit Number 3    Date for PT Re-Evaluation 09/02/22    PT Start Time 1600    PT Stop Time 1640    PT Time Calculation (min) 40 min    Activity Tolerance Patient tolerated treatment well    Behavior During Therapy [facility] for tasks assessed/performed               Past Medical History:  Diagnosis Date   Allergy    Diabetes mellitus without complication ([place])    Hypertension    Past Surgical History:  Procedure Laterality Date   [place]   Patient Active Problem List   Diagnosis Date Noted   Chronic midline low back pain without sciatica 06/24/2022   Dyslipidemia 07/21/2021   Sensorineural hearing loss (SNHL) of left ear with unrestricted hearing of right ear 07/11/2021   Hidradenitis suppurativa 05/21/2021   Allergic rhinitis 05/21/2021   History of colon polyps 05/21/2021   Essential (primary) hypertension 11/01/2020   Type 2 diabetes mellitus ([place]) 11/01/2020   Rosacea 06/28/2018   Obesity (BMI 30-39.9) 07/21/2016    PCP: Haydee Salter, MD  REFERRING PROVIDER: Haydee Salter, MD  REFERRING DIAG:   M54.50,G89.29 (ICD-10-CM) - Chronic midline low back pain without sciatica    Rationale for Evaluation and Treatment: Rehabilitation  THERAPY DIAG:  Other lack of coordination  Muscle weakness (generalized)  Chronic midline low back pain without sciatica  ONSET DATE: 06/24/22  SUBJECTIVE:                                                                                                                                                                                           SUBJECTIVE STATEMENT: Patient reports he is performing the exercises, he is not able to perform daily due to his schedule with work and kids but getting  along well with the exercises PERTINENT HISTORY:  Per referring Physician: Mr. Kovaleski has a past history of lower back pain. He wonders about the best approach to preventing worsening issues with this over time. He has been working on weight loss. He has gone to the gym in the past and used trainers, but is not actively engaged currently.  DM2, L 2nd toe pain and blister.  PAIN:  Are you having pain? Yes: NPRS scale: 1/10 Pain location: middle of the low back Pain description: aching Aggravating factors: Nothing specific, fearful to lift weights. Relieving  factors: Pain usually passes on its own.  PRECAUTIONS: None  WEIGHT BEARING RESTRICTIONS: No  FALLS:  Has patient fallen in last 6 months? 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  PLOF: Independent  PATIENT GOALS: Wants to learn how to move correctly and get stronger in his core to protect his back from further injury. Some weight lifting would be nice. Wants to lose a little more weight.  NEXT MD VISIT: 08/31/22  OBJECTIVE:   DIAGNOSTIC FINDINGS:  02/17/19-lumbar Xrays (-) for acute osseous abnormality. FINDINGS: Five non rib-bearing lumbar type vertebra. Lumbar alignment within normal limits. The vertebral body heights are grossly maintained. Disc spaces are maintained. Mild posterior facet degenerative change.  SCREENING FOR RED FLAGS: Bowel or bladder incontinence: No Spinal tumors: No Cauda equina syndrome: No Compression fracture: No Abdominal aneurysm: No  COGNITION: Overall cognitive status: Within functional limits for tasks assessed     SENSATION: WFL  MUSCLE LENGTH: Hamstrings: Right 70 deg; Left 58 deg Thomas  test:WNL B  POSTURE: decreased lumbar lordosis and decreased thoracic kyphosis  PALPATION: Mildly TTP along midline at L4  LUMBAR ROM:   AROM eval  Flexion WNL  Extension WNL, feels tight  Right lateral flexion WNL, feels tight  Left lateral flexion WNL, feels tight  Right rotation 60%  Left rotation 80%   (Blank rows = not tested)  LOWER EXTREMITY ROM:  WNL, tight at end range in hip flexion and IR B.  LOWER EXTREMITY MMT:  5/5 LE, trunk appears less stable, Decreased coordinated stabilization.   LUMBAR SPECIAL TESTS:  Straight leg raise test: Negative, Slump test: Negative, and FABER test: Negative  GAIT: Distance walked: In clinic distances Assistive device utilized: None Level of assistance: Complete Independence Comments: No abnormalities noted. Patient reports he used to run and walk for several miles, but unable to at this time.  TODAY'S TREATMENT:                                                                                                                              DATE:  NuStep L5 x 6 Instructed in therex designed to further address core proprioception and strength as below:  Quadriped pelvic tilt, core sets Quadriped alt arm lifts, quadriped alt hip extension, attempted quadriped alt arm and leg lifts but patient unable to maintain pelvic tilt, so advised to break down into just arms and legs, then advance appropriately Side planks, patient initially modified with weight on lower knee, then increased difficulty , weight on B feet. Demonstrated and discussed advancement of each of the above exercises by increasing static hold time and reps., also to add more extremity movements and advance to light weights  as control improves.  07/13/22 NuStep L5 x 6 Supine pelvic tilt, bridge, clamshell, March with pelvic tilt, 10 each Standing B side step against G tband at knees, knee flexion with G band at ankles, heel raises. 10 reps each Seated knee flex with 35#, 2 x  10 reps Seated knee ext with 20#, 2 x 10 reps  07/08/22  SKTC, DKTC-Did not feel stretch Figure 4 stretch-1 x 20 sec B Supine glut stretch-1 x 20 sec B  PATIENT EDUCATION:  Education details: POC, HEP Person educated: Patient Education method: Explanation, Demonstration, and Handouts Education comprehension: verbalized understanding and returned demonstration  HOME EXERCISE PROGRAM: Access Code: [email or web address] URL: [email or web address] Date: 07/20/2022 Prepared by: Warren Lacy Layan Zalenski  Exercises - Quadruped Alternating Arm Lift  - 1 x daily - 7 x weekly - 3 sets - 10 reps - Quadruped Alternating Leg Extensions  - 1 x daily - 7 x weekly - 3 sets - 10 reps - Bird Dog  - 1 x daily - 7 x weekly - 3 sets - 10 reps - Side Plank on Elbow  - 1 x daily - 7 x weekly - 3 sets - 10 reps - Side Plank on Full Arm with Rotation  - 1 x daily - 7 x weekly - 3 sets - 10 reps [id number]  ASSESSMENT:  CLINICAL IMPRESSION: Patient reports no new issues. Continued to advance home program.  He now has numerous typed of exercises to perform to engage his core.  He did have some instability that was more pronounced with the quadriped alt arm and leg lifts, so may need to be reassessed for this exercise next week to address his control.  OBJECTIVE IMPAIRMENTS: decreased activity tolerance, decreased coordination, decreased endurance, difficulty walking, decreased ROM, decreased strength, increased fascial restrictions, increased muscle spasms, impaired flexibility, improper body mechanics, postural dysfunction, and pain.   ACTIVITY LIMITATIONS: carrying, lifting, bending, squatting, and locomotion level  PARTICIPATION LIMITATIONS: cleaning, community activity, and occupation  PERSONAL FACTORS: Past/current experiences are also affecting patient's functional outcome.   REHAB POTENTIAL: Good  CLINICAL DECISION MAKING: Stable/uncomplicated  EVALUATION COMPLEXITY: Low   GOALS: Goals reviewed with  patient? Yes  SHORT TERM GOALS: Target date: 07/21/22  I with initial HEP Baseline: Goal status: MET   LONG TERM GOALS: Target date: 09/02/22  I with final HEP Baseline:  Goal status: IN PROGRESS  2.  Patient will demonstrate correct plank position and verbalize understanding of how long to hold the position.  Baseline:  Goal status: IN PROGRESS  3.  Patient will maintain pelvic tilt while performing dead bugs x 5 each side. Baseline:  Goal status: IN PROGRESS  4.  Patient will verbalize understanding of quality over quantity with exercise, including how to identify when to stop any particular activity. Baseline:  Goal status: IN PROGRESS  5.  Patient will verbalize appropriate progression of exercise to promote maximal gains in strength and cardio vascular performance with minimal injury risk. Baseline:  Goal status: IN PROGRESS  PLAN:  PT FREQUENCY: 1x/week  PT DURATION: 8 weeks  PLANNED INTERVENTIONS: Therapeutic exercises, Therapeutic activity, Neuromuscular re-education, Balance training, Gait training, Patient/Family education, Self Care, Joint mobilization, Dry Needling, Cryotherapy, Moist heat, Ionotophoresis '4mg'$ /ml Dexamethasone, and Manual therapy.  PLAN FOR NEXT SESSION: Update HEP with core stabilization/strength activities.  Hayden Pedro, PT, DPT 07/20/2022, 4:58 PM

## 2022-07-21 ENCOUNTER — Other Ambulatory Visit (HOSPITAL_COMMUNITY): Payer: Self-pay

## 2022-07-29 ENCOUNTER — Encounter: Payer: Self-pay | Admitting: Physical Therapy

## 2022-07-29 ENCOUNTER — Ambulatory Visit: Payer: 59 | Admitting: Physical Therapy

## 2022-07-29 DIAGNOSIS — G8929 Other chronic pain: Secondary | ICD-10-CM

## 2022-07-29 DIAGNOSIS — R278 Other lack of coordination: Secondary | ICD-10-CM | POA: Diagnosis not present

## 2022-07-29 DIAGNOSIS — M6281 Muscle weakness (generalized): Secondary | ICD-10-CM

## 2022-07-29 NOTE — Therapy (Signed)
OUTPATIENT PHYSICAL THERAPY THORACOLUMBAR EVALUATION   Patient Name: Christian Neal MRN: [id number] DOB:February 15, 1976, 47 y.o., male Today's Date: 07/29/2022  END OF SESSION:  PT End of Session - 07/29/22 1725     Visit Number 4    Date for PT Re-Evaluation 09/02/22    PT Start Time 1719    PT Stop Time 1800    PT Time Calculation (min) 41 min    Activity Tolerance Patient tolerated treatment well    Behavior During Therapy [facility] for tasks assessed/performed                Past Medical History:  Diagnosis Date   Allergy    Diabetes mellitus without complication ([place])    Hypertension    Past Surgical History:  Procedure Laterality Date   [place]   Patient Active Problem List   Diagnosis Date Noted   Chronic midline low back pain without sciatica 06/24/2022   Dyslipidemia 07/21/2021   Sensorineural hearing loss (SNHL) of left ear with unrestricted hearing of right ear 07/11/2021   Hidradenitis suppurativa 05/21/2021   Allergic rhinitis 05/21/2021   History of colon polyps 05/21/2021   Essential (primary) hypertension 11/01/2020   Type 2 diabetes mellitus ([employer]) 11/01/2020   Rosacea 06/28/2018   Obesity (BMI 30-39.9) 07/21/2016    PCP: Haydee Salter, MD  REFERRING PROVIDER: Haydee Salter, MD  REFERRING DIAG:   M54.50,G89.29 (ICD-10-CM) - Chronic midline low back pain without sciatica    Rationale for Evaluation and Treatment: Rehabilitation  THERAPY DIAG:  Other lack of coordination  Muscle weakness (generalized)  Chronic midline low back pain without sciatica  ONSET DATE: 06/24/22  SUBJECTIVE:                                                                                                                                                                                           SUBJECTIVE STATEMENT: Patient reports that the core stability exercises are challenging, but he is progressing.  PERTINENT HISTORY:  Per referring  Physician: Mr. Millard has a past history of lower back pain. He wonders about the best approach to preventing worsening issues with this over time. He has been working on weight loss. He has gone to the gym in the past and used trainers, but is not actively engaged currently.  DM2, L 2nd toe pain and blister.  PAIN:  Are you having pain? Yes: NPRS scale: 1/10 Pain location: middle of the low back Pain description: aching Aggravating factors: Nothing specific, fearful to lift weights. Relieving factors: Pain usually passes on its own.  PRECAUTIONS: None  WEIGHT BEARING RESTRICTIONS:  No  FALLS:  Has patient fallen in last 6 months? 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  PLOF: Independent  PATIENT GOALS: Wants to learn how to move correctly and get stronger in his core to protect his back from further injury. Some weight lifting would be nice. Wants to lose a little more weight.  NEXT MD VISIT: 08/31/22  OBJECTIVE:   DIAGNOSTIC FINDINGS:  02/17/19-lumbar Xrays (-) for acute osseous abnormality. FINDINGS: Five non rib-bearing lumbar type vertebra. Lumbar alignment within normal limits. The vertebral body heights are grossly maintained. Disc spaces are maintained. Mild posterior facet degenerative change.  SCREENING FOR RED FLAGS: Bowel or bladder incontinence: No Spinal tumors: No Cauda equina syndrome: No Compression fracture: No Abdominal aneurysm: No  COGNITION: Overall cognitive status: Within functional limits for tasks assessed     SENSATION: WFL  MUSCLE LENGTH: Hamstrings: Right 70 deg; Left 58 deg Thomas test:WNL B  POSTURE: decreased lumbar lordosis and decreased  thoracic kyphosis  PALPATION: Mildly TTP along midline at L4  LUMBAR ROM:   AROM eval  Flexion WNL  Extension WNL, feels tight  Right lateral flexion WNL, feels tight  Left lateral flexion WNL, feels tight  Right rotation 60%  Left rotation 80%   (Blank rows = not tested)  LOWER EXTREMITY ROM:  WNL, tight at end range in hip flexion and IR B.  LOWER EXTREMITY MMT:  5/5 LE, trunk appears less stable, Decreased coordinated stabilization.   LUMBAR SPECIAL TESTS:  Straight leg raise test: Negative, Slump test: Negative, and FABER test: Negative  GAIT: Distance walked: In clinic distances Assistive device utilized: None Level of assistance: Complete Independence Comments: No abnormalities noted. Patient reports he used to run and walk for several miles, but unable to at this time.  TODAY'S TREATMENT:                                                                                                                              DATE:  07/27/22 NuStep L5 x 6  Cat/cow into bird dog x 5 each leg Side plank hip bumps on knees x 5 each Supine bridge with knee ext, feet on block x 10 Standing eccentric heel raises on step x 5 each leg Seated figure 4 and glut stretches, HS stretch Standing thread the needle for trunk mobilization.  07/20/22 NuStep L5 x 6 Instructed in therex designed to further address core proprioception and strength as below:  Quadriped pelvic tilt, core sets Quadriped alt arm lifts, quadriped alt hip extension, attempted quadriped alt arm and leg lifts but patient unable to maintain pelvic tilt, so advised to break down into just arms and legs, then advance appropriately  Side planks, patient initially modified with weight on lower knee, then increased difficulty , weight on B feet. Demonstrated and discussed advancement of each of the above exercises by increasing static hold time and reps., also to add more extremity movements and advance to light weights as control  improves.  07/13/22 NuStep L5 x 6 Supine pelvic tilt, bridge, clamshell, March with pelvic tilt, 10 each Standing B side step against G tband at knees, knee flexion with G band at ankles, heel raises. 10 reps each Seated knee flex with 35#, 2 x 10 reps Seated knee ext with 20#, 2 x 10 reps  07/08/22  SKTC, DKTC-Did not feel stretch Figure 4 stretch-1 x 20 sec B Supine glut stretch-1 x 20 sec B  PATIENT EDUCATION:  Education details: POC, HEP Person educated: Patient Education method: Explanation, Demonstration, and Handouts Education comprehension: verbalized understanding and returned demonstration  HOME EXERCISE PROGRAM: Access Code: [email or web address] URL: [email or web address] Date: 07/20/2022 Prepared by: Warren Lacy Speaks  Exercises - Quadruped Alternating Arm Lift  - 1 x daily - 7 x weekly - 3 sets - 10 reps - Quadruped Alternating Leg Extensions  - 1 x daily - 7 x weekly - 3 sets - 10 reps - Bird Dog  - 1 x daily - 7 x weekly - 3 sets - 10 reps - Side Plank on Elbow  - 1 x daily - 7 x weekly - 3 sets - 10 reps - Side Plank on Full Arm with Rotation  - 1 x daily - 7 x weekly - 3 sets - 10 reps [id number]  ASSESSMENT:  CLINICAL IMPRESSION: Patient reports progress with Hep, recognizes he is still unstable, progressing with good form. HEP updated some of his exercises and added some addition stretches.  OBJECTIVE IMPAIRMENTS: decreased activity tolerance, decreased coordination, decreased endurance, difficulty walking, decreased ROM, decreased strength, increased fascial restrictions, increased muscle spasms, impaired flexibility, improper body mechanics, postural dysfunction, and pain.   ACTIVITY LIMITATIONS: carrying, lifting, bending, squatting, and locomotion level  PARTICIPATION LIMITATIONS: cleaning, community activity, and occupation  PERSONAL FACTORS: Past/current experiences are also affecting patient's functional outcome.   REHAB POTENTIAL: Good  CLINICAL  DECISION MAKING: Stable/uncomplicated  EVALUATION COMPLEXITY: Low   GOALS: Goals reviewed with patient? Yes  SHORT TERM GOALS: Target date: 07/21/22  I with initial HEP Baseline: Goal status: MET   LONG TERM GOALS: Target date: 09/02/22  I with final HEP Baseline:  Goal status: IN PROGRESS  2.  Patient will demonstrate correct plank position and verbalize understanding of how long to hold the position.  Baseline:  Goal status: IN PROGRESS  3.  Patient will maintain pelvic tilt while performing dead bugs x 5 each side. Baseline:  Goal status: IN PROGRESS  4.  Patient will verbalize understanding of quality over quantity with exercise, including how to identify when to stop any particular activity. Baseline:  Goal status: IN PROGRESS  5.  Patient will verbalize appropriate progression of exercise to promote maximal gains in strength and cardio vascular performance with minimal injury risk. Baseline:  Goal status: IN PROGRESS  PLAN:  PT FREQUENCY: 1x/week  PT DURATION: 8 weeks  PLANNED INTERVENTIONS: Therapeutic exercises, Therapeutic activity, Neuromuscular re-education, Balance training, Gait training, Patient/Family education, Self Care, Joint mobilization, Dry Needling, Cryotherapy, Moist heat, Ionotophoresis '4mg'$ /ml Dexamethasone, and Manual therapy.  PLAN FOR NEXT SESSION: Update HEP with core stabilization/strength activities.  Amy Speaks, PT, DPT 07/29/2022, 6:01 PM

## 2022-08-03 ENCOUNTER — Other Ambulatory Visit: Payer: Self-pay

## 2022-08-03 ENCOUNTER — Ambulatory Visit: Payer: 59

## 2022-08-03 DIAGNOSIS — M6281 Muscle weakness (generalized): Secondary | ICD-10-CM

## 2022-08-03 DIAGNOSIS — R278 Other lack of coordination: Secondary | ICD-10-CM | POA: Diagnosis not present

## 2022-08-03 DIAGNOSIS — M545 Low back pain, unspecified: Secondary | ICD-10-CM

## 2022-08-03 NOTE — Therapy (Signed)
OUTPATIENT PHYSICAL THERAPY THORACOLUMBAR EVALUATION   Patient Name: Christian Neal MRN: [id number] DOB:Jul 20, 1975, 47 y.o., male Today's Date: 08/03/2022  END OF SESSION:  PT End of Session - 08/03/22 1641     Visit Number 5    Date for PT Re-Evaluation 09/02/22    PT Start Time 1615    PT Stop Time 1700    PT Time Calculation (min) 45 min                 Past Medical History:  Diagnosis Date   Allergy    Diabetes mellitus without complication ([place])    Hypertension    Past Surgical History:  Procedure Laterality Date   FACIAL RECONSTRUCTION SURGERY  1997   Patient Active Problem List   Diagnosis Date Noted   Chronic midline low back pain without sciatica 06/24/2022   Dyslipidemia 07/21/2021   Sensorineural hearing loss (SNHL) of left ear with unrestricted hearing of right ear 07/11/2021   Hidradenitis suppurativa 05/21/2021   Allergic rhinitis 05/21/2021   History of colon polyps 05/21/2021   Essential (primary) hypertension 11/01/2020   Type 2 diabetes mellitus ([place]) 11/01/2020   Rosacea 06/28/2018   Obesity (BMI 30-39.9) 07/21/2016    PCP: Haydee Salter, MD  REFERRING PROVIDER: Haydee Salter, MD  REFERRING DIAG:   M54.50,G89.29 (ICD-10-CM) - Chronic midline low back pain without sciatica    Rationale for Evaluation and Treatment: Rehabilitation  THERAPY DIAG:  Other lack of coordination  Muscle weakness (generalized)  Chronic midline low back pain without sciatica  ONSET DATE: 06/24/22  SUBJECTIVE:                                                                                                                                                                                           SUBJECTIVE STATEMENT: Patient reports that he is progressing, trying to develop a routine, spends about an hour at a time performing the exercises  PERTINENT HISTORY:  Per referring Physician: Mr. Arceneaux has a past history of lower back pain. He wonders about the  best approach to preventing worsening issues with this over time. He has been working on weight loss. He has gone to the gym in the past and used trainers, but is not actively engaged currently.  DM2, L 2nd toe pain and blister.  PAIN:  Are you having pain? Yes: NPRS scale: 1/10 Pain location: middle of the low back Pain description: aching Aggravating factors: Nothing specific, fearful to lift weights. Relieving factors: Pain usually passes on its own.  PRECAUTIONS: None  WEIGHT BEARING RESTRICTIONS: No  FALLS:  Has patient fallen in last 6  months? 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  PLOF: Independent  PATIENT GOALS: Wants to learn how to move correctly and get stronger in his core to protect his back from further injury. Some weight lifting would be nice. Wants to lose a little more weight.  NEXT MD VISIT: 08/31/22  OBJECTIVE:   DIAGNOSTIC FINDINGS:  02/17/19-lumbar Xrays (-) for acute osseous abnormality. FINDINGS: Five non rib-bearing lumbar type vertebra. Lumbar alignment within normal limits. The vertebral body heights are grossly maintained. Disc spaces are maintained. Mild posterior facet degenerative change.  SCREENING FOR RED FLAGS: Bowel or bladder incontinence: No Spinal tumors: No Cauda equina syndrome: No Compression fracture: No Abdominal aneurysm: No  COGNITION: Overall cognitive status: Within functional limits for tasks assessed     SENSATION: WFL  MUSCLE LENGTH: Hamstrings: Right 70 deg; Left 58 deg Thomas test:WNL B  POSTURE: decreased lumbar lordosis and decreased thoracic kyphosis  PALPATION: Mildly TTP along midline at L4  LUMBAR ROM:    AROM eval  Flexion WNL  Extension WNL, feels tight  Right lateral flexion WNL, feels tight  Left lateral flexion WNL, feels tight  Right rotation 60%  Left rotation 80%   (Blank rows = not tested)  LOWER EXTREMITY ROM:  WNL, tight at end range in hip flexion and IR B.  LOWER EXTREMITY MMT:  5/5 LE, trunk appears less stable, Decreased coordinated stabilization.   LUMBAR SPECIAL TESTS:  Straight leg raise test: Negative, Slump test: Negative, and FABER test: Negative  GAIT: Distance walked: In clinic distances Assistive device utilized: None Level of assistance: Complete Independence Comments: No abnormalities noted. Patient reports he used to run and walk for several miles, but unable to at this time.  TODAY'S TREATMENT:                                                                                                                              DATE:  08/03/22: Reviewed his current program specifically the exercises in which he was instructed last week, including threading the needle.  Instructed him in additional tools to utilize to stretch and strengthen, to add variety and prevent accomodation with his core musculature: Treadmill walking sideways, varying incline Walking backwards on treadmill, varying incline 75cm ball for supine LS extensor stretch Prone over 75 cm ball for alternating arm and leg lifts as well as alt hip ext.    07/27/22 NuStep L5 x 6  Cat/cow into bird dog x 5 each leg Side plank hip bumps on knees x 5 each Supine bridge with knee ext, feet on block x 10 Standing eccentric heel raises on step x 5 each leg Seated figure 4 and glut stretches,  HS stretch Standing thread the needle for trunk mobilization.  07/20/22 NuStep L5 x 6 Instructed in therex designed to further address core proprioception and strength as below:  Quadriped pelvic tilt, core sets Quadriped alt arm lifts, quadriped alt hip extension, attempted quadriped alt arm and leg lifts but  patient unable to maintain pelvic tilt, so advised to break down into just arms and legs, then advance appropriately Side planks, patient initially modified with weight on lower knee, then increased difficulty , weight on B feet. Demonstrated and discussed advancement of each of the above exercises by increasing static hold time and reps., also to add more extremity movements and advance to light weights as control improves.  07/13/22 NuStep L5 x 6 Supine pelvic tilt, bridge, clamshell, March with pelvic tilt, 10 each Standing B side step against G tband at knees, knee flexion with G band at ankles, heel raises. 10 reps each Seated knee flex with 35#, 2 x 10 reps Seated knee ext with 20#, 2 x 10 reps  07/08/22  SKTC, DKTC-Did not feel stretch Figure 4 stretch-1 x 20 sec B Supine glut stretch-1 x 20 sec B  PATIENT EDUCATION:  Education details: POC, HEP Person educated: Patient Education method: Explanation, Demonstration, and Handouts Education comprehension: verbalized understanding and returned demonstration  HOME EXERCISE PROGRAM: Access Code: [email or web address] URL: [email or web address] Date: 07/20/2022 Prepared by: Warren Lacy Kyilee Gregg  Exercises - Quadruped Alternating Arm Lift  - 1 x daily - 7 x weekly - 3 sets - 10 reps - Quadruped Alternating Leg Extensions  - 1 x daily - 7 x weekly - 3 sets - 10 reps - Bird Dog  - 1 x daily - 7 x weekly - 3 sets - 10 reps - Side Plank on Elbow  - 1 x daily - 7 x weekly - 3 sets - 10 reps - Side Plank on Full Arm with Rotation  - 1 x daily - 7 x weekly - 3 sets - 10 reps [id number]  ASSESSMENT:  CLINICAL IMPRESSION: Patient reports progress with Hep, recognizes he is still unstable, progressing with good form. Today instructed in use of treadmill as well as large therapeutic ball as other tools that he may use to engage his core, for additional challenges to his home routine.he is planning to dc next week, wants help condensing/ prioritizing a  routine for home.  OBJECTIVE IMPAIRMENTS: decreased activity tolerance, decreased coordination, decreased endurance, difficulty walking, decreased ROM, decreased strength, increased fascial restrictions, increased muscle spasms, impaired flexibility, improper body mechanics, postural dysfunction, and pain.   ACTIVITY LIMITATIONS: carrying, lifting, bending, squatting, and locomotion level  PARTICIPATION LIMITATIONS: cleaning, community activity, and occupation  PERSONAL FACTORS: Past/current experiences are also affecting patient's functional outcome.   REHAB POTENTIAL: Good  CLINICAL DECISION MAKING: Stable/uncomplicated  EVALUATION COMPLEXITY: Low   GOALS: Goals reviewed with patient? Yes  SHORT TERM GOALS: Target date: 07/21/22  I with initial HEP Baseline: Goal status: MET   LONG TERM GOALS: Target date: 09/02/22  I with final HEP Baseline:  Goal status: IN PROGRESS  2.  Patient will demonstrate correct plank position and verbalize understanding of how long to hold the position.  Baseline:  Goal status: IN PROGRESS  3.  Patient will maintain pelvic tilt while performing dead bugs x 5 each side. Baseline:  Goal status: IN PROGRESS  4.  Patient will verbalize understanding of quality over quantity with exercise, including how to identify when to stop any particular activity. Baseline:  Goal status: IN PROGRESS  5.  Patient will verbalize appropriate progression of exercise to promote maximal gains in strength and cardio vascular performance with minimal injury risk. Baseline:  Goal status: IN PROGRESS  PLAN:  PT FREQUENCY: 1x/week  PT DURATION: 8 weeks  PLANNED INTERVENTIONS: Therapeutic exercises, Therapeutic activity, Neuromuscular re-education, Balance training, Gait training, Patient/Family education, Self Care, Joint mobilization, Dry Needling, Cryotherapy, Moist heat, Ionotophoresis 4mg /ml Dexamethasone, and Manual therapy.  PLAN FOR NEXT SESSION: Update  HEP with core stabilization/strength activities.  Demari Kropp, PT, DPT 08/03/2022, 5:00 PM

## 2022-08-10 ENCOUNTER — Ambulatory Visit: Payer: 59 | Admitting: Physical Therapy

## 2022-08-10 ENCOUNTER — Encounter: Payer: Self-pay | Admitting: Physical Therapy

## 2022-08-10 DIAGNOSIS — R278 Other lack of coordination: Secondary | ICD-10-CM

## 2022-08-10 DIAGNOSIS — M545 Low back pain, unspecified: Secondary | ICD-10-CM

## 2022-08-10 DIAGNOSIS — G8929 Other chronic pain: Secondary | ICD-10-CM

## 2022-08-10 DIAGNOSIS — M6281 Muscle weakness (generalized): Secondary | ICD-10-CM

## 2022-08-10 NOTE — Therapy (Signed)
OUTPATIENT PHYSICAL THERAPY THORACOLUMBAR EVALUATION  PHYSICAL THERAPY DISCHARGE SUMMARY  Visits from Start of Care: 6  Current functional level related to goals / functional outcomes: All goals met   Remaining deficits: Patient continues to build his strength and trunk stability.   Education / Equipment: HEP   Patient agrees to discharge. Patient goals were met. Patient is being discharged due to being pleased with the current functional level.  Patient Name: Christian Neal MRN: [id number] DOB:05-17-1976, 47 y.o., male Today's Date: 08/10/2022  END OF SESSION:  PT End of Session - 08/10/22 1720     Visit Number 6    Date for PT Re-Evaluation 09/02/22    PT Start Time [id number]    PT Stop Time 1755    PT Time Calculation (min) 40 min    Activity Tolerance Patient tolerated treatment well    Behavior During Therapy [facility] for tasks assessed/performed            Past Medical History:  Diagnosis Date   Allergy    Diabetes mellitus without complication ([place])    Hypertension    Past Surgical History:  Procedure Laterality Date   [place]   Patient Active Problem List   Diagnosis Date Noted   Chronic midline low back pain without sciatica 06/24/2022   Dyslipidemia 07/21/2021   Sensorineural hearing loss (SNHL) of left ear with unrestricted hearing of right ear 07/11/2021   Hidradenitis suppurativa 05/21/2021   Allergic rhinitis 05/21/2021   History of colon polyps 05/21/2021   Essential (primary) hypertension 11/01/2020   Type 2 diabetes mellitus ([place]) 11/01/2020   Rosacea 06/28/2018   Obesity (BMI 30-39.9) 07/21/2016    PCP: Christian Salter, MD  REFERRING PROVIDER: Haydee Salter, MD  REFERRING DIAG:   M54.50,G89.29 (ICD-10-CM) - Chronic midline low back pain without sciatica    Rationale for Evaluation and Treatment: Rehabilitation  THERAPY DIAG:  Other lack of coordination  Muscle weakness (generalized)  Chronic midline low  back pain without sciatica  ONSET DATE: 06/24/22  SUBJECTIVE:                                                                                                                                                                                           SUBJECTIVE STATEMENT: Patient reports that he is progressing, trying to develop a routine, spends about an hour at a time performing the exercises  PERTINENT HISTORY:  Per referring Physician: Mr. Christian Neal has a past history of lower back pain. He wonders about the best approach to preventing worsening issues with this over time. He has been working on weight loss.  He has gone to the gym in the past and used trainers, but is not actively engaged currently.  DM2, L 2nd toe pain and blister.  PAIN:  Are you having pain? Yes: NPRS scale: 1/10 Pain location: middle of the low back Pain description: aching Aggravating factors: Nothing specific, fearful to lift weights. Relieving factors: Pain usually passes on its own.  PRECAUTIONS: None  WEIGHT BEARING RESTRICTIONS: No  FALLS:  Has patient fallen in last 6 months? 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  PLOF: Independent  PATIENT GOALS: Wants to learn how to move correctly and get stronger in his core to protect his back from further injury. Some weight lifting would be nice. Wants to lose a little more weight.  NEXT MD VISIT: 08/31/22  OBJECTIVE:   DIAGNOSTIC FINDINGS:  02/17/19-lumbar Xrays (-) for acute osseous abnormality. FINDINGS: Five non rib-bearing lumbar type vertebra. Lumbar alignment within normal limits. The vertebral body heights are grossly maintained. Disc spaces are maintained.  Mild posterior facet degenerative change.  SCREENING FOR RED FLAGS: Bowel or bladder incontinence: No Spinal tumors: No Cauda equina syndrome: No Compression fracture: No Abdominal aneurysm: No  COGNITION: Overall cognitive status: Within functional limits for tasks assessed     SENSATION: WFL  MUSCLE LENGTH: Hamstrings: Right 70 deg; Left 58 deg Thomas test:WNL B  POSTURE: decreased lumbar lordosis and decreased thoracic kyphosis  PALPATION: Mildly TTP along midline at L4  LUMBAR ROM:   AROM eval  Flexion WNL  Extension WNL, feels tight  Right lateral flexion WNL, feels tight  Left lateral flexion WNL, feels tight  Right rotation 60%  Left rotation 80%   (Blank rows = not tested)  LOWER EXTREMITY ROM:  WNL, tight at end range in hip flexion and IR B.  LOWER EXTREMITY MMT:  5/5 LE, trunk appears less stable, Decreased coordinated stabilization.   LUMBAR SPECIAL TESTS:  Straight leg raise test: Negative, Slump test: Negative, and FABER test: Negative  GAIT: Distance walked: In clinic distances Assistive device utilized: None Level of assistance: Complete Independence Comments: No abnormalities noted. Patient reports he used to run and walk for several miles, but unable to at this time.  TODAY'S TREATMENT:                                                                                                                              DATE:  08/10/22 NuStep L5 x 6 minutes Treadmill walking without turning on the power-forward, back, R, L, 10 reps Re-assessed functional status, see goals Bird dogs x 5 on each side Side planks x 30 and then 15 sec on each side. Supine with legs over physioball- bridge, bridge  with B hip IR, 3-5  08/03/22: Reviewed his current program specifically the exercises in which he was instructed last week, including threading the needle.  Instructed him in additional tools to utilize to stretch and strengthen, to add variety and prevent  accomodation with his core musculature: Treadmill walking sideways, varying incline Walking backwards on treadmill, varying incline 75cm ball for supine LS extensor stretch Prone over 75 cm ball for alternating arm and leg lifts as well as alt hip ext.    07/27/22 NuStep L5 x 6  Cat/cow into bird dog x 5 each leg Side plank hip bumps on knees x 5 each Supine bridge with knee ext, feet on block x 10 Standing eccentric heel raises on step x 5 each leg Seated figure 4 and glut stretches, HS stretch Standing thread the needle for trunk mobilization.  07/20/22 NuStep L5 x 6 Instructed in therex designed to further address core proprioception and strength as below:  Quadriped pelvic tilt, core sets Quadriped alt arm lifts, quadriped alt hip extension, attempted quadriped alt arm and leg lifts but patient unable to maintain pelvic tilt, so advised to break down into just arms and legs, then advance appropriately Side planks, patient initially modified with weight on lower knee, then increased difficulty , weight on B feet. Demonstrated and discussed advancement of each of the above exercises by increasing static hold time and reps., also to add more extremity movements and advance to light weights as control improves.  07/13/22 NuStep L5 x 6 Supine pelvic tilt, bridge, clamshell, March with pelvic tilt, 10 each Standing B side step against G tband at knees, knee flexion with G band at ankles, heel raises. 10 reps each Seated knee flex with 35#, 2 x 10 reps Seated knee ext with 20#, 2 x 10 reps  07/08/22  SKTC, DKTC-Did not feel stretch Figure 4 stretch-1 x 20 sec B Supine glut stretch-1 x 20 sec B  PATIENT EDUCATION:  Education details: POC, HEP Person educated: Patient Education method: Explanation, Demonstration, and Handouts Education comprehension: verbalized understanding and returned demonstration  HOME EXERCISE PROGRAM: Access Code: [email or web address] URL:  [email or web address] Date: 07/20/2022 Prepared by: Warren Lacy Speaks  Exercises - Quadruped Alternating Arm Lift  - 1 x daily - 7 x weekly - 3 sets - 10 reps - Quadruped Alternating Leg Extensions  - 1 x daily - 7 x weekly - 3 sets - 10 reps - Bird Dog  - 1 x daily - 7 x weekly - 3 sets - 10 reps - Side Plank on Elbow  - 1 x daily - 7 x weekly - 3 sets - 10 reps - Side Plank on Full Arm with Rotation  - 1 x daily - 7 x weekly - 3 sets - 10 reps [id number]  ASSESSMENT:  CLINICAL IMPRESSION: Patient reports progress with HEP. He feels he is ready for D/C. Re-assessed his status, he met all goals. Reviewed HEP one more time along with education on how to progress.  OBJECTIVE IMPAIRMENTS: decreased activity tolerance, decreased coordination, decreased endurance, difficulty walking, decreased ROM, decreased strength, increased fascial restrictions, increased muscle spasms, impaired flexibility, improper body mechanics, postural dysfunction, and pain.   ACTIVITY LIMITATIONS: carrying, lifting, bending, squatting, and locomotion level  PARTICIPATION LIMITATIONS: cleaning, community activity, and occupation  PERSONAL FACTORS: Past/current experiences are also affecting patient's functional outcome.   REHAB POTENTIAL: Good  CLINICAL DECISION MAKING: Stable/uncomplicated  EVALUATION COMPLEXITY: Low   GOALS: Goals reviewed with patient? Yes  SHORT TERM GOALS: Target date:  07/21/22  I with initial HEP Baseline: Goal status: MET   LONG TERM GOALS: Target date: 09/02/22  I with final HEP Baseline:  Goal status: met  2.  Patient will demonstrate correct plank position and verbalize understanding of how long to hold the position.  Baseline:  Goal status: met  3.  Patient will maintain pelvic tilt while performing dead bugs x 5 each side. Baseline:  Goal status: met  4.  Patient will verbalize understanding of quality over quantity with exercise, including how to identify  when to stop any particular activity. Baseline:  Goal status: met  5.  Patient will verbalize appropriate progression of exercise to promote maximal gains in strength and cardio vascular performance with minimal injury risk. Baseline:  Goal status: met  PLAN:  PT FREQUENCY: 1x/week  PT DURATION: 8 weeks  PLANNED INTERVENTIONS: Therapeutic exercises, Therapeutic activity, Neuromuscular re-education, Balance training, Gait training, Patient/Family education, Self Care, Joint mobilization, Dry Needling, Cryotherapy, Moist heat, Ionotophoresis 4mg /ml Dexamethasone, and Manual therapy.  PLAN FOR NEXT SESSION: Update HEP with core stabilization/strength activities.  Ethel Rana DPT 08/10/22 5:51 PM  08/10/2022, 5:51 PM

## 2022-08-24 ENCOUNTER — Other Ambulatory Visit (HOSPITAL_COMMUNITY): Payer: Self-pay

## 2022-08-31 ENCOUNTER — Encounter: Payer: Self-pay | Admitting: Family Medicine

## 2022-08-31 ENCOUNTER — Ambulatory Visit (INDEPENDENT_AMBULATORY_CARE_PROVIDER_SITE_OTHER): Payer: 59 | Admitting: Family Medicine

## 2022-08-31 ENCOUNTER — Other Ambulatory Visit (HOSPITAL_COMMUNITY): Payer: Self-pay

## 2022-08-31 VITALS — BP 122/74 | HR 64 | Temp 97.9°F | Ht 75.5 in | Wt 261.6 lb

## 2022-08-31 DIAGNOSIS — Z0000 Encounter for general adult medical examination without abnormal findings: Secondary | ICD-10-CM | POA: Diagnosis not present

## 2022-08-31 DIAGNOSIS — I10 Essential (primary) hypertension: Secondary | ICD-10-CM

## 2022-08-31 DIAGNOSIS — E119 Type 2 diabetes mellitus without complications: Secondary | ICD-10-CM

## 2022-08-31 DIAGNOSIS — E785 Hyperlipidemia, unspecified: Secondary | ICD-10-CM | POA: Diagnosis not present

## 2022-08-31 LAB — LIPID PANEL
Cholesterol: 135 mg/dL (ref 0–200)
HDL: 32.2 mg/dL — ABNORMAL LOW (ref >39.00)
LDL Cholesterol: 68 mg/dL (ref 0–99)
NonHDL: 102.76
Total CHOL/HDL Ratio: 4
Triglycerides: 173 mg/dL — ABNORMAL HIGH (ref 0.0–149.0)
VLDL: 34.6 mg/dL (ref 0.0–40.0)

## 2022-08-31 LAB — BASIC METABOLIC PANEL
BUN: 14 mg/dL (ref 6–23)
CO2: 26 mEq/L (ref 19–32)
Calcium: 9.3 mg/dL (ref 8.4–10.5)
Chloride: 106 mEq/L (ref 96–112)
Creatinine, Ser: 0.83 mg/dL (ref 0.40–1.50)
GFR: 104.5 mL/min (ref >60.00)
Glucose, Bld: 117 mg/dL — ABNORMAL HIGH (ref 70–99)
Potassium: 4.4 mEq/L (ref 3.5–5.1)
Sodium: 140 mEq/L (ref 135–145)

## 2022-08-31 LAB — HEMOGLOBIN A1C: Hgb A1c MFr Bld: 6.5 % (ref 4.6–6.5)

## 2022-08-31 MED ORDER — ACCU-CHEK AVIVA PLUS VI STRP
1.0000 | ORAL_STRIP | 3 refills | Status: DC | PRN
Start: 2022-08-31 — End: 2022-09-11
  Filled 2022-08-31: qty 100, fill #0
  Filled 2022-08-31: qty 50, 30d supply, fill #0

## 2022-08-31 MED ORDER — ACCU-CHEK SOFTCLIX LANCETS MISC
3 refills | Status: DC
Start: 2022-08-31 — End: 2022-09-11
  Filled 2022-08-31: qty 100, 30d supply, fill #0

## 2022-08-31 NOTE — Assessment & Plan Note (Signed)
I will reassess lipids today. Will try and readdress use of statin after results return.

## 2022-08-31 NOTE — Assessment & Plan Note (Signed)
Blood pressure is in good control. Continue losartan 50 mg daily. We discussed him stopping his spironolactone. He should monitor his BP at home off of this medicine.
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
## 2022-08-31 NOTE — Assessment & Plan Note (Addendum)
Has been at goal on metformin. He asked about CGM. I do not see this as indicated in his situation. He can continue intermittent CBGs at home. We will check his annual labs today. Continue metformin 500 mg daily.

## 2022-09-01 LAB — URINALYSIS, ROUTINE W REFLEX MICROSCOPIC
Bilirubin Urine: NEGATIVE
Hgb urine dipstick: NEGATIVE
Ketones, ur: NEGATIVE
Leukocytes,Ua: NEGATIVE
Nitrite: NEGATIVE
RBC / HPF: NONE SEEN (ref 0–?)
Specific Gravity, Urine: >=1.03 — AB (ref 1.000–1.030)
Total Protein, Urine: NEGATIVE
Urine Glucose: NEGATIVE
Urobilinogen, UA: 0.2 (ref 0.0–1.0)
pH: 6 (ref 5.0–8.0)

## 2022-09-01 LAB — MICROALBUMIN / CREATININE URINE RATIO
Creatinine,U: 213 mg/dL
Microalb Creat Ratio: 0.4 mg/g (ref 0.0–30.0)
Microalb, Ur: 0.8 mg/dL (ref 0.0–1.9)

## 2022-09-05 ENCOUNTER — Other Ambulatory Visit (HOSPITAL_COMMUNITY): Payer: Self-pay

## 2022-09-10 ENCOUNTER — Other Ambulatory Visit (HOSPITAL_COMMUNITY): Payer: Self-pay

## 2022-09-10 ENCOUNTER — Encounter: Payer: Self-pay | Admitting: Family Medicine

## 2022-09-11 ENCOUNTER — Other Ambulatory Visit (HOSPITAL_COMMUNITY): Payer: Self-pay

## 2022-09-11 ENCOUNTER — Other Ambulatory Visit: Payer: Self-pay

## 2022-09-11 MED ORDER — ACCU-CHEK FASTCLIX LANCETS MISC
1.0000 | Freq: Every day | 2 refills | Status: AC
  Filled 2022-09-11: qty 102, 90d supply, fill #0
  Filled 2022-09-25: qty 102, 30d supply, fill #0

## 2022-09-11 MED ORDER — ACCU-CHEK GUIDE VI STRP
1.0000 | ORAL_STRIP | 2 refills | Status: AC | PRN
  Filled 2022-09-11: qty 100, 60d supply, fill #0
  Filled 2022-09-25: qty 100, 30d supply, fill #0

## 2022-09-25 ENCOUNTER — Other Ambulatory Visit (HOSPITAL_COMMUNITY): Payer: Self-pay

## 2022-09-28 ENCOUNTER — Encounter: Payer: Self-pay | Admitting: Family Medicine

## 2022-09-28 DIAGNOSIS — G4733 Obstructive sleep apnea (adult) (pediatric): Secondary | ICD-10-CM | POA: Insufficient documentation

## 2022-09-28 HISTORY — DX: Obstructive sleep apnea (adult) (pediatric): G47.33

## 2022-10-12 ENCOUNTER — Other Ambulatory Visit: Payer: Self-pay | Admitting: Family Medicine

## 2022-10-12 MED ORDER — LOSARTAN POTASSIUM 50 MG PO TABS
100.0000 mg | ORAL_TABLET | Freq: Every day | ORAL | 3 refills | Status: DC
  Filled 2022-10-12: qty 60, 30d supply, fill #0
  Filled 2022-11-26 (×2): qty 60, 30d supply, fill #1
  Filled 2023-02-10: qty 60, 30d supply, fill #2
  Filled 2023-03-29: qty 60, 30d supply, fill #3
  Filled 2023-06-25: qty 60, 30d supply, fill #4
  Filled 2023-07-27: qty 60, 30d supply, fill #5

## 2022-10-13 ENCOUNTER — Other Ambulatory Visit (HOSPITAL_COMMUNITY): Payer: Self-pay

## 2022-10-13 ENCOUNTER — Other Ambulatory Visit: Payer: Self-pay

## 2022-10-16 ENCOUNTER — Other Ambulatory Visit (HOSPITAL_COMMUNITY): Payer: Self-pay

## 2022-10-23 LAB — HM DIABETES EYE EXAM

## 2022-10-26 ENCOUNTER — Other Ambulatory Visit: Payer: Self-pay | Admitting: Family Medicine

## 2022-10-26 ENCOUNTER — Other Ambulatory Visit (HOSPITAL_COMMUNITY): Payer: Self-pay

## 2022-10-26 DIAGNOSIS — I10 Essential (primary) hypertension: Secondary | ICD-10-CM

## 2022-10-27 ENCOUNTER — Other Ambulatory Visit (HOSPITAL_COMMUNITY): Payer: Self-pay

## 2022-10-27 ENCOUNTER — Encounter: Payer: Self-pay | Admitting: Family Medicine

## 2022-10-27 MED ORDER — SPIRONOLACTONE 25 MG PO TABS
25.0000 mg | ORAL_TABLET | Freq: Every day | ORAL | 3 refills | Status: DC
  Filled 2022-10-27: qty 30, 30d supply, fill #0
  Filled 2022-11-26 (×2): qty 30, 30d supply, fill #1
  Filled 2023-02-10: qty 30, 30d supply, fill #2
  Filled 2023-02-23 – 2023-03-09 (×2): qty 30, 30d supply, fill #3
  Filled 2023-03-28 – 2023-03-31 (×4): qty 30, 30d supply, fill #4
  Filled 2023-04-29: qty 30, 30d supply, fill #5
  Filled 2023-05-30: qty 30, 30d supply, fill #6
  Filled 2023-06-29: qty 30, 30d supply, fill #7
  Filled 2023-07-27: qty 30, 30d supply, fill #8
  Filled 2023-09-05: qty 30, 30d supply, fill #9

## 2022-10-27 MED ORDER — ERYTHROMYCIN 5 MG/GM OP OINT
TOPICAL_OINTMENT | OPHTHALMIC | 3 refills | Status: DC
  Filled 2022-10-27: qty 3.5, 7d supply, fill #0

## 2022-11-02 ENCOUNTER — Other Ambulatory Visit (HOSPITAL_COMMUNITY): Payer: Self-pay

## 2022-11-04 ENCOUNTER — Ambulatory Visit (INDEPENDENT_AMBULATORY_CARE_PROVIDER_SITE_OTHER): Payer: 59 | Admitting: Family Medicine

## 2022-11-04 VITALS — BP 126/70 | HR 86 | Temp 98.3°F | Wt 276.8 lb

## 2022-11-04 DIAGNOSIS — J069 Acute upper respiratory infection, unspecified: Secondary | ICD-10-CM | POA: Insufficient documentation
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
## 2022-11-04 NOTE — Assessment & Plan Note (Signed)
Discussed home care for viral illness, including rest, pushing fluids, and OTC medications as needed for symptom relief. Recommend hot tea with honey for sore throat symptoms. Follow-up if needed for worsening or persistent symptoms.  

## 2022-11-07 ENCOUNTER — Encounter: Payer: Self-pay | Admitting: Family Medicine

## 2022-11-09 ENCOUNTER — Encounter: Payer: Self-pay | Admitting: Family Medicine

## 2022-11-09 ENCOUNTER — Ambulatory Visit (INDEPENDENT_AMBULATORY_CARE_PROVIDER_SITE_OTHER): Payer: 59 | Admitting: Family Medicine

## 2022-11-09 ENCOUNTER — Other Ambulatory Visit (HOSPITAL_COMMUNITY): Payer: Self-pay

## 2022-11-09 VITALS — BP 128/74 | HR 78 | Temp 98.0°F | Ht 75.5 in | Wt 277.8 lb

## 2022-11-09 DIAGNOSIS — J0100 Acute maxillary sinusitis, unspecified: Secondary | ICD-10-CM | POA: Diagnosis not present

## 2022-11-09 MED ORDER — AMOXICILLIN-POT CLAVULANATE 875-125 MG PO TABS
1.0000 | ORAL_TABLET | Freq: Two times a day (BID) | ORAL | 0 refills | Status: DC
Start: 2022-11-09 — End: 2022-12-07
  Filled 2022-11-09: qty 20, 10d supply, fill #0

## 2022-11-09 MED ORDER — BENZONATATE 200 MG PO CAPS
200.0000 mg | ORAL_CAPSULE | Freq: Two times a day (BID) | ORAL | 0 refills | Status: DC | PRN
Start: 2022-11-09 — End: 2022-12-07
  Filled 2022-11-09: qty 20, 10d supply, fill #0

## 2022-11-09 NOTE — Telephone Encounter (Signed)
Caller Name: Izaia Call back phone #: [phone]  Reason for Call: pt called to follow up on multiple mycharts msgs he sent over the weekend. Pt asking for something to be sent in for him. Please advise.  Pharmacy: [facility]
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
## 2022-11-09 NOTE — Telephone Encounter (Signed)
Called patient and scheduled him an appointment for today at 4:00 pm to f/u on congestion per last OV.  Dm/cma

## 2022-11-26 ENCOUNTER — Other Ambulatory Visit (HOSPITAL_COMMUNITY): Payer: Self-pay

## 2022-12-07 ENCOUNTER — Ambulatory Visit (INDEPENDENT_AMBULATORY_CARE_PROVIDER_SITE_OTHER): Payer: 59 | Admitting: Family Medicine

## 2022-12-07 ENCOUNTER — Encounter: Payer: Self-pay | Admitting: Family Medicine

## 2022-12-07 VITALS — BP 120/70 | HR 71 | Temp 97.6°F | Ht 75.5 in | Wt 274.4 lb

## 2022-12-07 DIAGNOSIS — Z7984 Long term (current) use of oral hypoglycemic drugs: Secondary | ICD-10-CM

## 2022-12-07 DIAGNOSIS — R058 Other specified cough: Secondary | ICD-10-CM

## 2022-12-07 DIAGNOSIS — I10 Essential (primary) hypertension: Secondary | ICD-10-CM | POA: Diagnosis not present

## 2022-12-07 DIAGNOSIS — E785 Hyperlipidemia, unspecified: Secondary | ICD-10-CM

## 2022-12-07 DIAGNOSIS — E119 Type 2 diabetes mellitus without complications: Secondary | ICD-10-CM | POA: Diagnosis not present

## 2022-12-07 LAB — HEMOGLOBIN A1C: Hgb A1c MFr Bld: 7 % — ABNORMAL HIGH (ref 4.6–6.5)

## 2022-12-07 LAB — GLUCOSE, RANDOM: Glucose, Bld: 148 mg/dL — ABNORMAL HIGH (ref 70–99)
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
## 2022-12-07 NOTE — Assessment & Plan Note (Addendum)
Appears to be mild post-viral cough. 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 Consider using hot tea with honey to resolve symptoms.

## 2022-12-07 NOTE — Assessment & Plan Note (Signed)
Christian Neal's LDL cholesterol is below goal range without the use of statins. I do not believe that a statin would be of any additional benefit for him.

## 2022-12-07 NOTE — Assessment & Plan Note (Addendum)
Blood pressure is in good control. Continue losartan 50 mg daily and spironolactone 25 mg daily.

## 2022-12-07 NOTE — Assessment & Plan Note (Signed)
I will check his A1c today. Continue metformin 500 mg daily.

## 2023-01-22 ENCOUNTER — Other Ambulatory Visit: Payer: Self-pay | Admitting: Family Medicine

## 2023-01-22 ENCOUNTER — Other Ambulatory Visit (HOSPITAL_COMMUNITY): Payer: Self-pay

## 2023-01-22 MED ORDER — METFORMIN HCL 500 MG PO TABS
500.0000 mg | ORAL_TABLET | Freq: Every day | ORAL | 3 refills | Status: DC
  Filled 2023-01-22: qty 30, 30d supply, fill #0
  Filled 2023-02-10 – 2023-02-23 (×2): qty 30, 30d supply, fill #1

## 2023-02-10 ENCOUNTER — Other Ambulatory Visit: Payer: Self-pay

## 2023-02-23 ENCOUNTER — Other Ambulatory Visit: Payer: Self-pay | Admitting: Family Medicine

## 2023-02-23 ENCOUNTER — Other Ambulatory Visit (HOSPITAL_COMMUNITY): Payer: Self-pay

## 2023-02-24 ENCOUNTER — Other Ambulatory Visit (HOSPITAL_COMMUNITY): Payer: Self-pay

## 2023-02-24 MED ORDER — PREVIDENT 5000 SENSITIVE 1.1-5 % DT GEL
DENTAL | 11 refills | Status: DC
  Filled 2023-02-24: qty 100, 30d supply, fill #0
  Filled 2023-07-26: qty 100, 30d supply, fill #1
  Filled 2023-12-26: qty 100, 30d supply, fill #2

## 2023-02-25 ENCOUNTER — Other Ambulatory Visit (HOSPITAL_COMMUNITY): Payer: Self-pay

## 2023-03-02 ENCOUNTER — Other Ambulatory Visit (HOSPITAL_COMMUNITY): Payer: Self-pay

## 2023-03-09 ENCOUNTER — Ambulatory Visit: Payer: 59 | Admitting: Family Medicine

## 2023-03-09 ENCOUNTER — Encounter: Payer: Self-pay | Admitting: Family Medicine

## 2023-03-09 ENCOUNTER — Other Ambulatory Visit (HOSPITAL_COMMUNITY): Payer: Self-pay

## 2023-03-09 VITALS — BP 130/78 | HR 68 | Temp 97.8°F | Ht 72.35 in | Wt 274.8 lb

## 2023-03-09 DIAGNOSIS — Z7984 Long term (current) use of oral hypoglycemic drugs: Secondary | ICD-10-CM

## 2023-03-09 DIAGNOSIS — E785 Hyperlipidemia, unspecified: Secondary | ICD-10-CM | POA: Diagnosis not present

## 2023-03-09 DIAGNOSIS — I10 Essential (primary) hypertension: Secondary | ICD-10-CM

## 2023-03-09 DIAGNOSIS — E119 Type 2 diabetes mellitus without complications: Secondary | ICD-10-CM

## 2023-03-09 LAB — GLUCOSE, RANDOM: Glucose, Bld: 148 mg/dL — ABNORMAL HIGH (ref 70–99)

## 2023-03-09 LAB — HEMOGLOBIN A1C: Hgb A1c MFr Bld: 7.6 % — ABNORMAL HIGH (ref 4.6–6.5)

## 2023-03-09 MED ORDER — METFORMIN HCL 500 MG PO TABS
500.0000 mg | ORAL_TABLET | Freq: Two times a day (BID) | ORAL | 3 refills | Status: DC
  Filled 2023-03-09 – 2023-03-28 (×2): qty 60, 30d supply, fill #0
  Filled 2023-04-29: qty 60, 30d supply, fill #1
  Filled 2023-05-30: qty 60, 30d supply, fill #2
  Filled 2023-06-29: qty 60, 30d supply, fill #3
  Filled 2023-07-27: qty 60, 30d supply, fill #4
  Filled 2023-09-05: qty 60, 30d supply, fill #5
  Filled 2023-10-09: qty 60, 30d supply, fill #6
  Filled 2023-11-24: qty 60, 30d supply, fill #7
  Filled 2024-01-19: qty 60, 30d supply, fill #8

## 2023-03-09 NOTE — Assessment & Plan Note (Signed)
Mr. Keadle's LDL cholesterol is below goal range without the use of statins. I do not believe that a statin would be of any additional benefit for him. I discussed this with him today. He prefers not to start a statin at this point.

## 2023-03-09 NOTE — Assessment & Plan Note (Signed)
At last check, Christian Neal's A1c had increased. His weight is stable and he is starting to exercise more. I will check his A1c today. Continue metformin 500 mg daily.
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
## 2023-03-09 NOTE — Addendum Note (Signed)
Addended by: Loyola Mast on: 03/09/2023 04:55 PM   Modules accepted: Orders

## 2023-03-09 NOTE — Assessment & Plan Note (Signed)
Blood pressure is in adequate control. Continue losartan 100 mg daily and spironolactone 25 mg daily.

## 2023-03-19 ENCOUNTER — Other Ambulatory Visit (HOSPITAL_COMMUNITY): Payer: Self-pay

## 2023-03-29 ENCOUNTER — Other Ambulatory Visit (HOSPITAL_COMMUNITY): Payer: Self-pay

## 2023-03-29 ENCOUNTER — Other Ambulatory Visit: Payer: Self-pay

## 2023-03-30 ENCOUNTER — Other Ambulatory Visit (HOSPITAL_COMMUNITY): Payer: Self-pay

## 2023-03-31 ENCOUNTER — Other Ambulatory Visit (HOSPITAL_COMMUNITY): Payer: Self-pay

## 2023-04-01 ENCOUNTER — Other Ambulatory Visit (HOSPITAL_COMMUNITY): Payer: Self-pay

## 2023-04-03 ENCOUNTER — Other Ambulatory Visit (HOSPITAL_COMMUNITY): Payer: Self-pay

## 2023-04-09 ENCOUNTER — Encounter: Payer: Self-pay | Admitting: Family Medicine

## 2023-05-15 IMAGING — DX DG CHEST 2V
2 series · 2 of 2 positions shown · non-contrast
Comparison: CT chest 02/23/2011

CLINICAL DATA: Cough and wheezing.

EXAM:
CHEST - 2 VIEW

[chest pa]
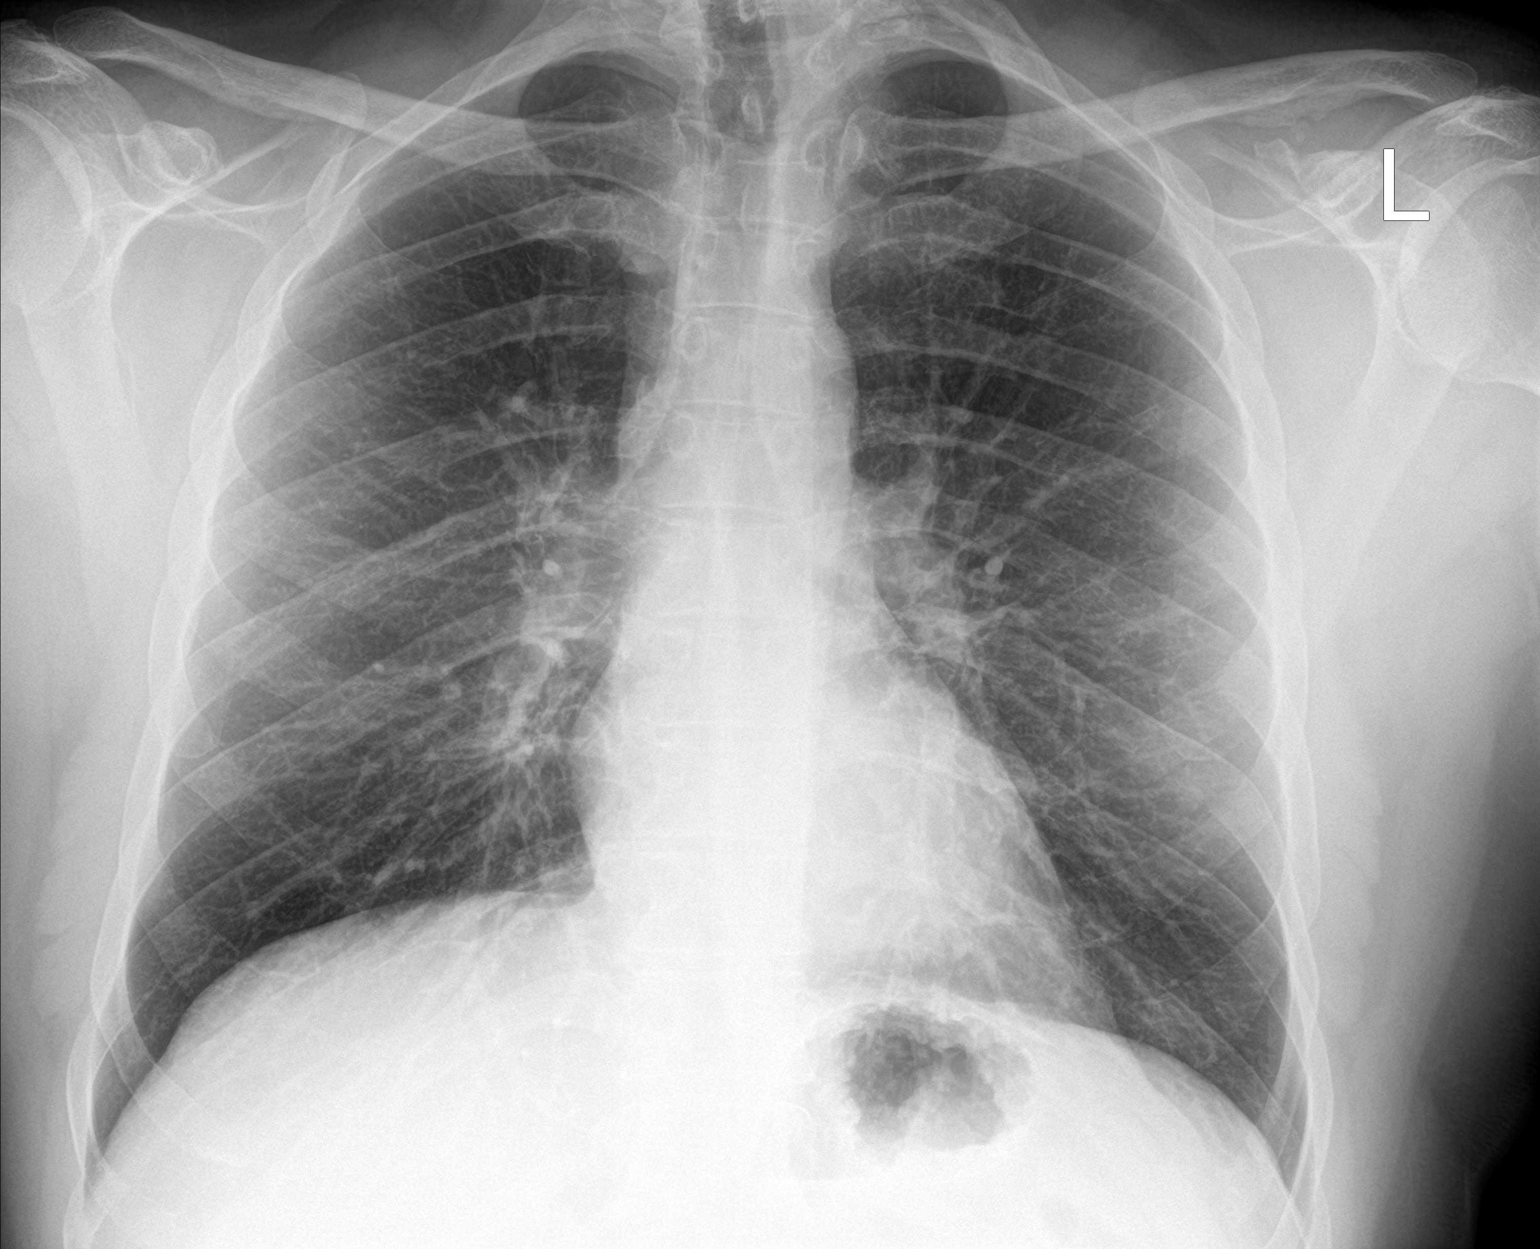

[chest lat]
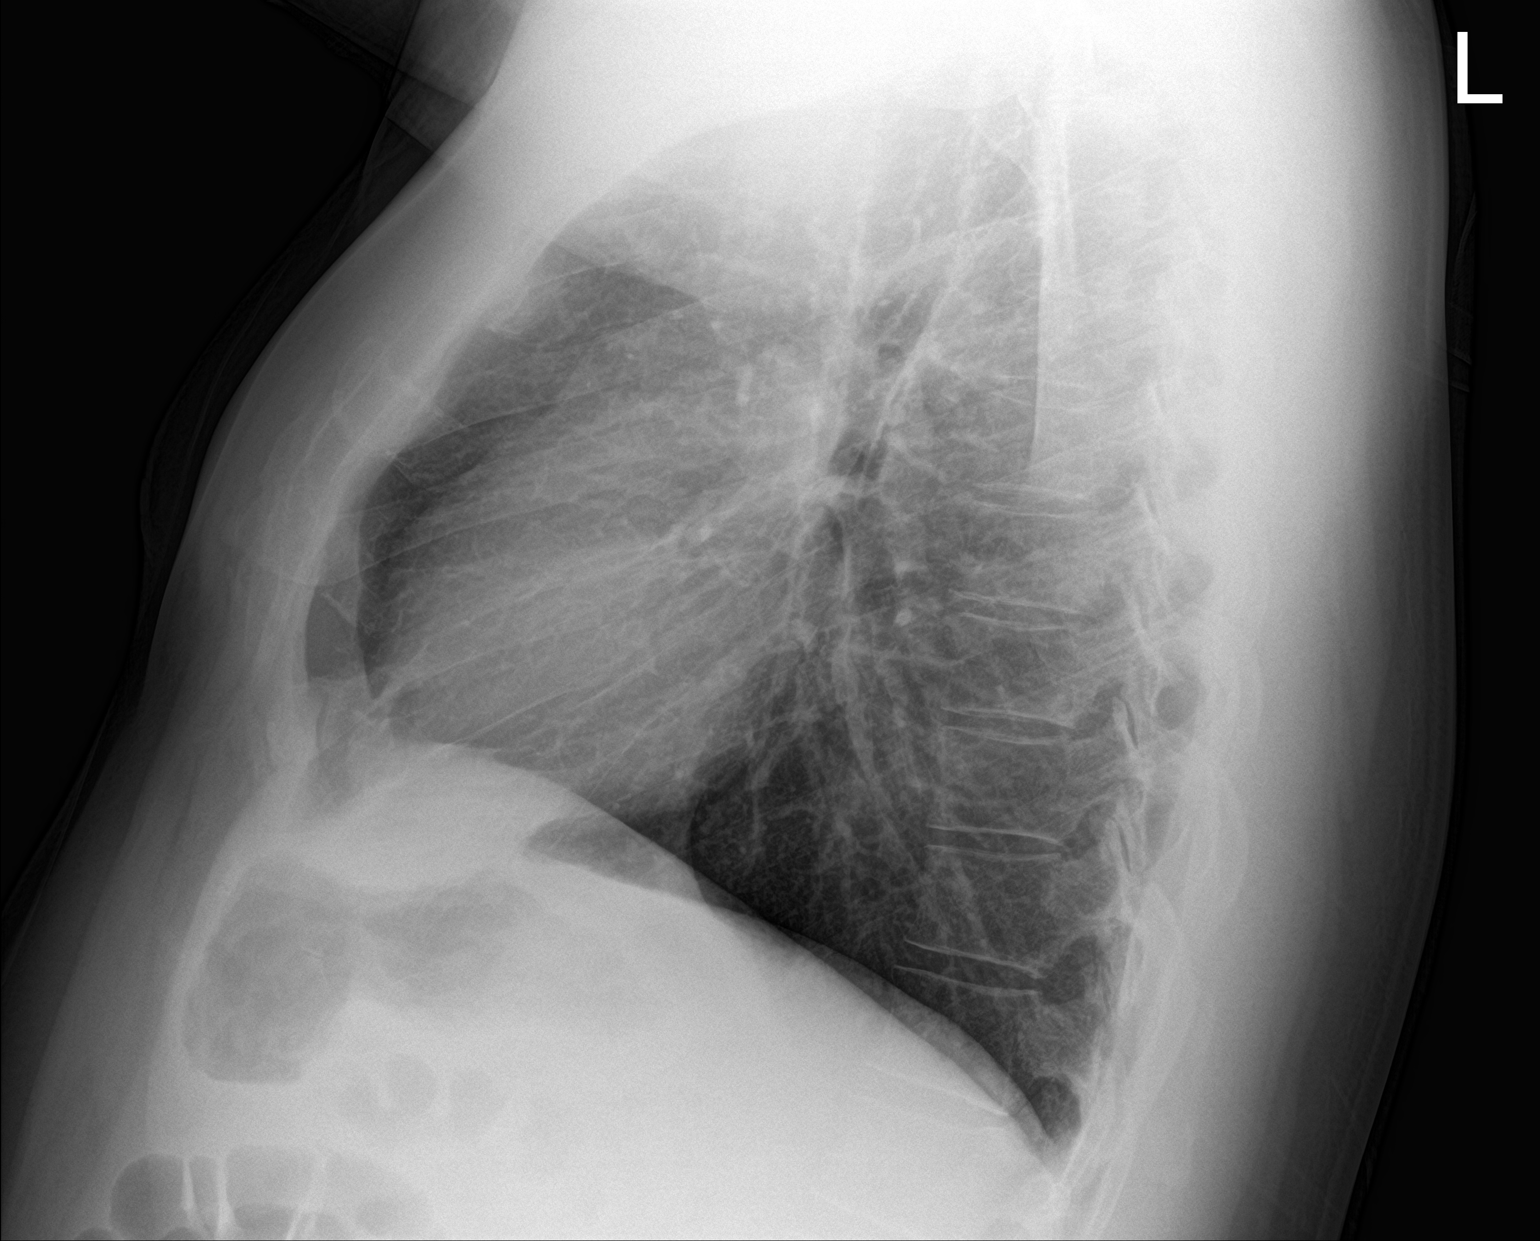

[2 of 2 positions shown; findings below may reference images not displayed]

FINDINGS: The heart size and mediastinal contours are within normal limits.
Both lungs are clear. The visualized skeletal structures are
unremarkable.
IMPRESSION: No active cardiopulmonary disease.

## 2023-06-02 ENCOUNTER — Other Ambulatory Visit (HOSPITAL_COMMUNITY): Payer: Self-pay

## 2023-06-09 ENCOUNTER — Encounter: Payer: Self-pay | Admitting: Family Medicine

## 2023-06-09 ENCOUNTER — Ambulatory Visit: Payer: 59 | Admitting: Family Medicine

## 2023-06-09 VITALS — BP 128/74 | HR 75 | Temp 98.2°F | Ht 75.5 in | Wt 266.4 lb

## 2023-06-09 DIAGNOSIS — Z7984 Long term (current) use of oral hypoglycemic drugs: Secondary | ICD-10-CM

## 2023-06-09 DIAGNOSIS — I10 Essential (primary) hypertension: Secondary | ICD-10-CM

## 2023-06-09 DIAGNOSIS — E119 Type 2 diabetes mellitus without complications: Secondary | ICD-10-CM

## 2023-06-09 LAB — GLUCOSE, RANDOM: Glucose, Bld: 135 mg/dL — ABNORMAL HIGH (ref 70–99)

## 2023-06-09 LAB — HEMOGLOBIN A1C: Hgb A1c MFr Bld: 6.9 % — ABNORMAL HIGH (ref 4.6–6.5)
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
## 2023-06-09 NOTE — Assessment & Plan Note (Signed)
At last check, Mr. Christian Neal's A1c had increased. His weight is down  and he is exercising more. I will check his A1c today. Continue metformin 500 mg twice daily.

## 2023-06-09 NOTE — Assessment & Plan Note (Signed)
Blood pressure is in good control. Continue losartan 100 mg daily and spironolactone 25 mg daily.

## 2023-06-25 ENCOUNTER — Other Ambulatory Visit (HOSPITAL_COMMUNITY): Payer: Self-pay

## 2023-06-25 ENCOUNTER — Other Ambulatory Visit: Payer: Self-pay | Admitting: Family Medicine

## 2023-06-25 MED ORDER — METRONIDAZOLE 0.75 % EX CREA
1.0000 | TOPICAL_CREAM | Freq: Every day | CUTANEOUS | 5 refills | Status: AC | PRN
  Filled 2023-06-25: qty 45, 31d supply, fill #0

## 2023-06-25 NOTE — Telephone Encounter (Signed)
 Requesting: metroNIDAZOLE  (METROCREAM ) 0.75 % cream  Last Visit: 06/09/2023 Next Visit: 10/04/2023 Last Refill: 03/09/2023 by Historical Provider  Please Advise

## 2023-06-28 ENCOUNTER — Other Ambulatory Visit: Payer: Self-pay | Admitting: Family Medicine

## 2023-06-29 MED ORDER — CLINDAMYCIN PHOSPHATE 1 % EX GEL: 1.0000 | Freq: Every day | CUTANEOUS | 5 refills | Status: AC | PRN

## 2023-07-27 ENCOUNTER — Other Ambulatory Visit: Payer: Self-pay

## 2023-07-27 ENCOUNTER — Other Ambulatory Visit (HOSPITAL_COMMUNITY): Payer: Self-pay

## 2023-07-28 ENCOUNTER — Other Ambulatory Visit (HOSPITAL_COMMUNITY): Payer: Self-pay

## 2023-09-27 ENCOUNTER — Encounter (HOSPITAL_COMMUNITY): Payer: Self-pay

## 2023-10-04 ENCOUNTER — Encounter: Payer: Self-pay | Admitting: Family Medicine

## 2023-10-04 ENCOUNTER — Ambulatory Visit: Payer: 59 | Admitting: Family Medicine

## 2023-10-04 ENCOUNTER — Other Ambulatory Visit (HOSPITAL_COMMUNITY): Payer: Self-pay

## 2023-10-04 ENCOUNTER — Ambulatory Visit: Payer: Self-pay | Admitting: Family Medicine

## 2023-10-04 VITALS — BP 120/72 | HR 76 | Temp 97.0°F | Ht 75.5 in | Wt 255.2 lb

## 2023-10-04 DIAGNOSIS — I10 Essential (primary) hypertension: Secondary | ICD-10-CM

## 2023-10-04 DIAGNOSIS — S46011A Strain of muscle(s) and tendon(s) of the rotator cuff of right shoulder, initial encounter: Secondary | ICD-10-CM

## 2023-10-04 DIAGNOSIS — E785 Hyperlipidemia, unspecified: Secondary | ICD-10-CM | POA: Diagnosis not present

## 2023-10-04 DIAGNOSIS — Z7984 Long term (current) use of oral hypoglycemic drugs: Secondary | ICD-10-CM

## 2023-10-04 DIAGNOSIS — L719 Rosacea, unspecified: Secondary | ICD-10-CM

## 2023-10-04 DIAGNOSIS — Z0000 Encounter for general adult medical examination without abnormal findings: Secondary | ICD-10-CM

## 2023-10-04 DIAGNOSIS — E119 Type 2 diabetes mellitus without complications: Secondary | ICD-10-CM | POA: Diagnosis not present

## 2023-10-04 HISTORY — DX: Encounter for general adult medical examination without abnormal findings: Z00.00

## 2023-10-04 LAB — URINALYSIS, ROUTINE W REFLEX MICROSCOPIC
Bilirubin Urine: NEGATIVE
Hgb urine dipstick: NEGATIVE
Ketones, ur: NEGATIVE
Leukocytes,Ua: NEGATIVE
Nitrite: NEGATIVE
RBC / HPF: NONE SEEN (ref 0–?)
Specific Gravity, Urine: 1.025 (ref 1.000–1.030)
Total Protein, Urine: NEGATIVE
Urine Glucose: NEGATIVE
Urobilinogen, UA: 0.2 (ref 0.0–1.0)
pH: 6 (ref 5.0–8.0)

## 2023-10-04 LAB — HEMOGLOBIN A1C: Hgb A1c MFr Bld: 6.3 % (ref 4.6–6.5)

## 2023-10-04 LAB — BASIC METABOLIC PANEL WITH GFR
BUN: 21 mg/dL (ref 6–23)
CO2: 29 meq/L (ref 19–32)
Calcium: 9.5 mg/dL (ref 8.4–10.5)
Chloride: 103 meq/L (ref 96–112)
Creatinine, Ser: 0.95 mg/dL (ref 0.40–1.50)
GFR: 94.84 mL/min (ref >60.00)
Glucose, Bld: 121 mg/dL — ABNORMAL HIGH (ref 70–99)
Potassium: 4.1 meq/L (ref 3.5–5.1)
Sodium: 140 meq/L (ref 135–145)

## 2023-10-04 LAB — MICROALBUMIN / CREATININE URINE RATIO
Creatinine,U: 105.3 mg/dL
Microalb Creat Ratio: UNDETERMINED mg/g (ref 0.0–30.0)
Microalb, Ur: <0.7 mg/dL

## 2023-10-04 LAB — LIPID PANEL
Cholesterol: 141 mg/dL (ref 0–200)
HDL: 36.2 mg/dL — ABNORMAL LOW (ref >39.00)
LDL Cholesterol: 75 mg/dL (ref 0–99)
NonHDL: 105.21
Total CHOL/HDL Ratio: 4
Triglycerides: 149 mg/dL (ref 0.0–149.0)
VLDL: 29.8 mg/dL (ref 0.0–40.0)

## 2023-10-04 MED ORDER — NAPROXEN 500 MG PO TABS
500.0000 mg | ORAL_TABLET | Freq: Two times a day (BID) | ORAL | 0 refills | Status: AC
  Filled 2023-10-04: qty 14, 7d supply, fill #0

## 2023-10-04 NOTE — Assessment & Plan Note (Addendum)
 A1c has been improved. Weight is down 19 lbs. since Oct. I will check annual DM labs. Foot exam completed. Continue metformin  500 mg twice daily.

## 2023-10-04 NOTE — Assessment & Plan Note (Signed)
 Christian Neal's LDL cholesterol is below goal range without the use of statins. I do not believe that a statin would be of any additional benefit for him. We discussed this previously. He prefers not to start a statin at this point.
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
## 2023-10-04 NOTE — Assessment & Plan Note (Signed)
 Overall health is excellent. Recommend ongoing regular exercise. Discussed recommended screenings and immunizations. Reviewed prior pneumococcal vaccinations. It appears he may have received two PPS-23 vaccines (2019- [facility], 2022- [place]). I do not feel receiving a PCV-20 at this point would make sense medically.

## 2023-10-04 NOTE — Assessment & Plan Note (Signed)
 Blood pressure is in good control. Continue losartan  100 mg daily. Stop spironolactone  25 mg daily.

## 2023-10-09 ENCOUNTER — Other Ambulatory Visit: Payer: Self-pay | Admitting: Family Medicine

## 2023-10-10 ENCOUNTER — Other Ambulatory Visit: Payer: Self-pay

## 2023-10-12 ENCOUNTER — Other Ambulatory Visit (HOSPITAL_COMMUNITY): Payer: Self-pay

## 2023-10-12 MED ORDER — LOSARTAN POTASSIUM 50 MG PO TABS
100.0000 mg | ORAL_TABLET | Freq: Every day | ORAL | 3 refills | Status: AC
  Filled 2023-10-12: qty 60, 30d supply, fill #0
  Filled 2023-11-24: qty 60, 30d supply, fill #1
  Filled 2024-01-19: qty 60, 30d supply, fill #2
  Filled 2024-04-17: qty 60, 30d supply, fill #3
  Filled 2024-05-19: qty 60, 30d supply, fill #4

## 2023-10-22 ENCOUNTER — Other Ambulatory Visit (HOSPITAL_BASED_OUTPATIENT_CLINIC_OR_DEPARTMENT_OTHER): Payer: Self-pay

## 2023-10-22 ENCOUNTER — Other Ambulatory Visit (HOSPITAL_COMMUNITY): Payer: Self-pay

## 2023-10-22 MED ORDER — SPIRONOLACTONE 50 MG PO TABS
50.0000 mg | ORAL_TABLET | Freq: Every day | ORAL | 3 refills | Status: AC
  Filled 2023-10-22: qty 30, 30d supply, fill #0
  Filled 2023-11-24: qty 30, 30d supply, fill #1
  Filled 2023-12-26: qty 30, 30d supply, fill #2
  Filled 2024-01-19: qty 30, 30d supply, fill #3
  Filled 2024-02-17: qty 30, 30d supply, fill #4
  Filled 2024-03-23: qty 30, 30d supply, fill #5
  Filled 2024-04-17: qty 30, 30d supply, fill #6
  Filled 2024-05-19: qty 30, 30d supply, fill #7

## 2023-10-22 NOTE — Addendum Note (Signed)
 Addended by: Graig Lawyer on: 10/22/2023 01:00 PM   Modules accepted: Orders

## 2023-11-04 ENCOUNTER — Encounter: Payer: Self-pay | Admitting: Family Medicine

## 2023-11-05 ENCOUNTER — Ambulatory Visit: Payer: Self-pay

## 2023-11-05 NOTE — Telephone Encounter (Signed)
 FYI Only or Action Required?: FYI only for provider.  Patient was last seen in primary care on 10/04/2023 by Graig Lawyer, MD. Called Nurse Triage reporting Back Pain. Symptoms began about a month ago. Interventions attempted: OTC medications: Aleve , topical pain ointment, Prescription medications: naproxen , and Ice/heat application. Symptoms are: lower back pain radiates down left leg, left foot numbness gradually worsening.  Triage Disposition: See PCP When Office is Open (Within 3 Days) (overriding See Physician Within 24 Hours)  Patient/caregiver understands and will follow disposition?: Yes            Copied from CRM [id number]. Topic: Clinical - Red Word Triage >> Nov 05, 2023 10:24 AM Christian Neal wrote: Reason for EXB:[id number] has been dealing with a case of sciatica in his left hip/leg since May 21st. Reason for Disposition  Numbness in a leg or foot (i.e., loss of sensation)  Answer Assessment - Initial Assessment Questions 1. ONSET: When did the pain begin?      X years, recently worsening over the past month.  2. LOCATION: Where does it hurt? (upper, mid or lower back)     Lower.  3. SEVERITY: How bad is the pain?  (e.g., Scale 1-10; mild, moderate, or severe)   - MILD (1-3): Doesn't interfere with normal activities.    - MODERATE (4-7): Interferes with normal activities or awakens from sleep.    - SEVERE (8-10): Excruciating pain, unable to do any normal activities.      2-3/10, if he squats it goes to 6-7.  4. PATTERN: Is the pain constant? (e.g., yes, no; constant, intermittent)      Constant.  5. RADIATION: Does the pain shoot into your legs or somewhere else?     Left hip and radiates down left leg (calf/foot).  6. CAUSE:  What do you think is causing the back pain?      He states it could be a pinched nerve or sciatica.  7. BACK OVERUSE:  Any recent lifting of heavy objects, strenuous work or exercise?     He states he may have overdone it  while working it or lifted something heavy while doing yard work. He states he moved a big trash bin at his in laws and he noticed the flare up since then.  8. MEDICINES: What have you taken so far for the pain? (e.g., nothing, acetaminophen, NSAIDS)     Naproxen , Aleve , topical pain ointment.  9. NEUROLOGIC SYMPTOMS: Do you have any weakness, numbness, or problems with bowel/bladder control?     Sometimes some tingling in his left foot. Denies issues with bowel or bladder control.  10. OTHER SYMPTOMS: Do you have any other symptoms? (e.g., fever, abdomen pain, burning with urination, blood in urine)       Patient denies fever, abdominal pain.  11. PREGNANCY: Is there any chance you are pregnant? When was your last menstrual period?       N/A.  Protocols used: Back Pain-A-AH

## 2023-11-08 ENCOUNTER — Encounter: Payer: Self-pay | Admitting: Family Medicine

## 2023-11-08 ENCOUNTER — Other Ambulatory Visit (HOSPITAL_COMMUNITY): Payer: Self-pay

## 2023-11-08 ENCOUNTER — Ambulatory Visit (INDEPENDENT_AMBULATORY_CARE_PROVIDER_SITE_OTHER): Admitting: Family Medicine

## 2023-11-08 VITALS — BP 124/78 | HR 72 | Temp 96.2°F | Ht 75.0 in | Wt 257.0 lb

## 2023-11-08 DIAGNOSIS — M5442 Lumbago with sciatica, left side: Secondary | ICD-10-CM

## 2023-11-08 MED ORDER — PREDNISONE 10 MG (48) PO TBPK
ORAL_TABLET | ORAL | 0 refills | Status: DC
  Filled 2023-11-08: qty 48, 12d supply, fill #0

## 2023-11-08 MED ORDER — GABAPENTIN 300 MG PO CAPS
300.0000 mg | ORAL_CAPSULE | Freq: Every day | ORAL | 3 refills | Status: DC
  Filled 2023-11-08: qty 30, 30d supply, fill #0

## 2023-11-08 NOTE — Progress Notes (Signed)
 Established Patient Office Visit   Subjective:  Patient ID: Christian Neal, male    DOB: 1976/02/06  Age: 48 y.o. MRN: [id number]  Chief Complaint  Patient presents with   Back Pain    Pt has a Hx of back pain. Pt states pain has increased and radiates from lower mid back, to hip down to feet. Pt complains of tingling in toes. Left side.     Back Pain Associated symptoms include tingling. Pertinent negatives include no abdominal pain or weakness.   Encounter Diagnoses  Name Primary?   Acute left-sided low back pain with left-sided sciatica Yes   1 month history of left lower back pain that has been moving into the back of his left thigh and calf.  There is intermittent tingling in the toes of his left foot.  All toes are involved he says.  No specific injury but it may have come from moving a 55 gallon drum that was full of yard waste.  Denies saddle paresthesias or bowel/bladder incontinence.  History of back problems in the past.   Review of Systems  Constitutional: Negative.   HENT: Negative.    Eyes:  Negative for blurred vision, discharge and redness.  Respiratory: Negative.    Cardiovascular: Negative.   Gastrointestinal:  Negative for abdominal pain.  Genitourinary: Negative.   Musculoskeletal:  Positive for back pain. Negative for myalgias.  Skin:  Negative for rash.  Neurological:  Positive for tingling. Negative for loss of consciousness and weakness.  Endo/Heme/Allergies:  Negative for polydipsia.     Current Outpatient Medications:    Accu-Chek FastClix Lancets MISC, Use to check blood sugar daily, Disp: 102 each, Rfl: 2   ascorbic acid (VITAMIN C) 1000 MG tablet, Take by mouth., Disp: , Rfl:    Cholecalciferol 125 MCG (5000 UT) TABS, Take by mouth., Disp: , Rfl:    clindamycin  (CLINDAGEL) 1 % gel, Apply 1 Application topically daily as needed., Disp: 30 g, Rfl: 5   gabapentin (NEURONTIN) 300 MG capsule, Take 1 capsule (300 mg total) by mouth at bedtime., Disp: 30  capsule, Rfl: 3   glucose blood (ACCU-CHEK GUIDE) test strip, Use to check blood sugar daily, Disp: 100 each, Rfl: 2   losartan  (COZAAR ) 50 MG tablet, Take 2 tablets (100 mg total) by mouth daily., Disp: 90 tablet, Rfl: 3   metFORMIN  (GLUCOPHAGE ) 500 MG tablet, Take 1 tablet (500 mg total) by mouth 2 (two) times daily with a meal., Disp: 180 tablet, Rfl: 3   metroNIDAZOLE  (METROCREAM ) 0.75 % cream, Apply 1 Application topically daily as needed., Disp: 45 g, Rfl: 5   Multiple Vitamins-Minerals (CENTRUM SILVER 50+MEN PO), Take 1 tablet by mouth daily., Disp: , Rfl:    naproxen  (NAPROSYN ) 500 MG tablet, Take 1 tablet (500 mg total) by mouth 2 (two) times daily with a meal., Disp: 14 tablet, Rfl: 0   predniSONE (STERAPRED UNI-PAK 48 TAB) 10 MG (48) TBPK tablet, Pharmacy to instruct dosing on a 12-day Dosepak please, Disp: 48 tablet, Rfl: 0   PREVIDENT  5000 SENSITIVE 1.1-5 % GEL, Brush teeth with a thin ribbon of paste for at least 2 minutes once daily., Disp: 100 mL, Rfl: 11   spironolactone  (ALDACTONE ) 50 MG tablet, Take 1 tablet (50 mg total) by mouth daily., Disp: 90 tablet, Rfl: 3   Triamcinolone Acetonide (NASACORT ALLERGY 24HR NA), Place into the nose., Disp: , Rfl:    zinc gluconate 50 MG tablet, Take by mouth., Disp: , Rfl:  Objective:     BP 124/78 (Cuff Size: Large)   Pulse 72   Temp (!) 96.2 F (35.7 C) (Temporal)   Ht 6' 3 (1.905 m)   Wt 257 lb (116.6 kg)   SpO2 98%   BMI 32.12 kg/m    Physical Exam Constitutional:      General: He is not in acute distress.    Appearance: Normal appearance. He is not ill-appearing, toxic-appearing or diaphoretic.  HENT:     Head: Normocephalic and atraumatic.     Right Ear: External ear normal.     Left Ear: External ear normal.   Eyes:     General: No scleral icterus.       Right eye: No discharge.        Left eye: No discharge.     Extraocular Movements: Extraocular movements intact.     Conjunctiva/sclera: Conjunctivae normal.    Pulmonary:     Effort: Pulmonary effort is normal. No respiratory distress.   Musculoskeletal:     Lumbar back: No bony tenderness. Decreased range of motion (Full range of motion with some difficulty). Negative right straight leg raise test and negative left straight leg raise test.   Skin:    General: Skin is warm and dry.   Neurological:     Mental Status: He is alert and oriented to person, place, and time.     Motor: No weakness.     Deep Tendon Reflexes:     Reflex Scores:      Patellar reflexes are 2+ on the right side and 2+ on the left side.      Achilles reflexes are 1+ on the right side and 1+ on the left side.  Psychiatric:        Mood and Affect: Mood normal.        Behavior: Behavior normal.      No results found for any visits on 11/08/23.    The 10-year ASCVD risk score (Arnett DK, et al., 2019) is: 5%    Assessment & Plan:   Acute left-sided low back pain with left-sided sciatica -     predniSONE; Pharmacy to instruct dosing on a 12-day Dosepak please  Dispense: 48 tablet; Refill: 0 -     Gabapentin; Take 1 capsule (300 mg total) by mouth at bedtime.  Dispense: 30 capsule; Refill: 3 -     Ambulatory referral to Physical Therapy    Return Schedule follow-up with Dr. Thedora in 1 month please.  Discussed prednisone side effects of increased appetite and possible irritability.  Believe that early initiation of PT would be helpful for him.  Elsie Sim Lent, MD

## 2023-11-25 ENCOUNTER — Encounter: Payer: Self-pay | Admitting: Family Medicine

## 2023-11-25 ENCOUNTER — Ambulatory Visit (INDEPENDENT_AMBULATORY_CARE_PROVIDER_SITE_OTHER): Admitting: Family Medicine

## 2023-11-25 ENCOUNTER — Other Ambulatory Visit (HOSPITAL_COMMUNITY): Payer: Self-pay

## 2023-11-25 VITALS — BP 120/74 | HR 80 | Temp 97.6°F | Ht 75.0 in | Wt 255.6 lb

## 2023-11-25 DIAGNOSIS — M5432 Sciatica, left side: Secondary | ICD-10-CM | POA: Diagnosis not present

## 2023-11-25 MED ORDER — DICLOFENAC SODIUM 75 MG PO TBEC
75.0000 mg | DELAYED_RELEASE_TABLET | Freq: Two times a day (BID) | ORAL | 1 refills | Status: DC
  Filled 2023-11-25: qty 30, 15d supply, fill #0

## 2023-11-25 NOTE — Assessment & Plan Note (Signed)
 Symptoms and exam are consistent with left sciatica. We reviewed pathogenesis of this. He has not responded to naproxen  or prednisone . Ibuprofen  helps, but concerned about taking this so frequently. I I recommend we try switching to diclofenac  bid. PT to start Monday. Recommend Christian Neal follow through with this to see if we can resolve his current issue. If not improving, consider orthopedic referral for a steroid injection.

## 2023-11-25 NOTE — Patient Instructions (Signed)
Sciatica  Sciatica is pain, numbness, weakness, or tingling along the path of the sciatic nerve. The sciatic nerve starts in the lower back and runs down the back of each leg. The nerve controls the muscles in the lower leg and in the back of the knee. It also provides feeling (sensation) to the back of the thigh, the lower leg, and the sole of the foot. Sciatica is a symptom of another medical condition that pinches or puts pressure on the sciatic nerve. Sciatica most often only affects one side of the body. Sciatica usually goes away on its own or with treatment. In some cases, sciatica may come back (recur). What are the causes? This condition is caused by pressure on the sciatic nerve or pinching of the nerve. This may be the result of: A disk in between the bones of the spine bulging out too far (herniated disk). Age-related changes in the spinal disks. A pain disorder that affects a muscle in the buttock. Extra bone growth near the sciatic nerve. A break (fracture) of the pelvis. Pregnancy. Tumor. This is rare. What increases the risk? The following factors may make you more likely to develop this condition: Playing sports that place pressure or stress on the spine. Having poor strength and flexibility. A history of back injury or surgery. Sitting for long periods of time. Doing activities that involve repetitive bending or lifting. Obesity. What are the signs or symptoms? Symptoms can vary from mild to very severe. They may include: Any of the following problems in the lower back, leg, hip, or buttock: Mild tingling, numbness, or dull aches. Burning sensations. Sharp pains. Numbness in the back of the calf or the sole of the foot. Leg weakness. Severe back pain that makes movement difficult. Symptoms may get worse when you cough, sneeze, or laugh, or when you sit or stand for long periods of time. How is this diagnosed? This condition may be diagnosed based on: Your symptoms  and medical history. A physical exam. Blood tests. Imaging tests, such as: X-rays. An MRI. A CT scan. How is this treated? In many cases, this condition improves on its own without treatment. However, treatment may include: Reducing or modifying physical activity. Exercising, including strengthening and stretching. Icing and applying heat to the affected area. Medicines that help to: Relieve pain and swelling. Relax your muscles. Injections of medicines that help to relieve pain and inflammation (steroids) around the sciatic nerve. Surgery. Follow these instructions at home: Medicines Take over-the-counter and prescription medicines only as told by your health care provider. Ask your health care provider if the medicine prescribed to you requires you to avoid driving or using heavy machinery. Managing pain     If directed, put ice on the affected area. To do this: Put ice in a plastic bag. Place a towel between your skin and the bag. Leave the ice on for 20 minutes, 2-3 times a day. If your skin turns bright red, remove the ice right away to prevent skin damage. The risk of skin damage is higher if you cannot feel pain, heat, or cold. If directed, apply heat to the affected area as often as told by your health care provider. Use the heat source that your health care provider recommends, such as a moist heat pack or a heating pad. Place a towel between your skin and the heat source. Leave the heat on for 20-30 minutes. If your skin turns bright red, remove the heat right away to prevent Gotschall. The   risk of Jansma is higher if you cannot feel pain, heat, or cold. Activity  Return to your normal activities as told by your health care provider. Ask your health care provider what activities are safe for you. Avoid activities that make your symptoms worse. Take brief periods of rest throughout the day. When you rest for longer periods, mix in some mild activity or stretching between  periods of rest. This will help to prevent stiffness and pain. Avoid sitting for long periods of time without moving. Get up and move around at least one time each hour. Exercise and stretch regularly as told by your health care provider. Do not lift anything that is heavier than 10 lb (4.5 kg) until your health care provider says that it is safe. When you do not have symptoms, you should still avoid heavy lifting, especially repetitive heavy lifting. When you lift objects, always use proper lifting technique, which includes: Bending your knees. Keeping the load close to your body. Avoiding twisting. General instructions Maintain a healthy weight. Excess weight puts extra stress on your back. Wear supportive, comfortable shoes. Avoid wearing high heels. Avoid sleeping on a mattress that is too soft or too hard. A mattress that is firm enough to support your back when you sleep may help to reduce your pain. Contact a health care provider if: Your pain is not controlled by medicine. Your pain does not improve or gets worse. Your pain lasts longer than 4 weeks. You have unexplained weight loss. Get help right away if: You are not able to control when you urinate or have bowel movements (incontinence). You have: Weakness in your lower back, pelvis, buttocks, or legs that gets worse. Redness or swelling of your back. A burning sensation when you urinate. Summary Sciatica is pain, numbness, weakness, or tingling along the path of the sciatic nerve, which may include the lower back, legs, hips, and buttocks. This condition is caused by pressure on the sciatic nerve or pinching of the nerve. Treatment often includes rest, exercise, medicines, and applying ice or heat. This information is not intended to replace advice given to you by your health care provider. Make sure you discuss any questions you have with your health care provider. Document Revised: 08/11/2021 Document Reviewed:  08/11/2021 Elsevier Patient Education  2024 [employer]  
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
## 2023-11-29 ENCOUNTER — Other Ambulatory Visit: Payer: Self-pay

## 2023-11-29 ENCOUNTER — Ambulatory Visit: Attending: Family Medicine

## 2023-11-29 DIAGNOSIS — M6281 Muscle weakness (generalized): Secondary | ICD-10-CM | POA: Diagnosis present

## 2023-11-29 DIAGNOSIS — G8929 Other chronic pain: Secondary | ICD-10-CM | POA: Insufficient documentation

## 2023-11-29 DIAGNOSIS — M5442 Lumbago with sciatica, left side: Secondary | ICD-10-CM | POA: Diagnosis present

## 2023-11-29 DIAGNOSIS — M545 Low back pain, unspecified: Secondary | ICD-10-CM | POA: Diagnosis present

## 2023-11-29 DIAGNOSIS — R278 Other lack of coordination: Secondary | ICD-10-CM | POA: Diagnosis present
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
## 2023-12-13 ENCOUNTER — Other Ambulatory Visit: Payer: Self-pay

## 2023-12-13 ENCOUNTER — Ambulatory Visit

## 2023-12-13 DIAGNOSIS — G8929 Other chronic pain: Secondary | ICD-10-CM

## 2023-12-13 DIAGNOSIS — M5442 Lumbago with sciatica, left side: Secondary | ICD-10-CM | POA: Diagnosis not present

## 2023-12-13 DIAGNOSIS — R278 Other lack of coordination: Secondary | ICD-10-CM

## 2023-12-13 DIAGNOSIS — M6281 Muscle weakness (generalized): Secondary | ICD-10-CM
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
## 2023-12-20 ENCOUNTER — Ambulatory Visit: Attending: Family Medicine | Admitting: Physical Therapy

## 2023-12-20 ENCOUNTER — Encounter: Payer: Self-pay | Admitting: Physical Therapy

## 2023-12-20 DIAGNOSIS — M545 Low back pain, unspecified: Secondary | ICD-10-CM | POA: Diagnosis present

## 2023-12-20 DIAGNOSIS — G8929 Other chronic pain: Secondary | ICD-10-CM | POA: Diagnosis present

## 2023-12-20 DIAGNOSIS — M6281 Muscle weakness (generalized): Secondary | ICD-10-CM | POA: Diagnosis present

## 2023-12-20 DIAGNOSIS — R278 Other lack of coordination: Secondary | ICD-10-CM | POA: Insufficient documentation

## 2023-12-20 DIAGNOSIS — M5442 Lumbago with sciatica, left side: Secondary | ICD-10-CM | POA: Diagnosis present
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
## 2023-12-27 ENCOUNTER — Other Ambulatory Visit: Payer: Self-pay

## 2023-12-27 ENCOUNTER — Other Ambulatory Visit (HOSPITAL_COMMUNITY): Payer: Self-pay

## 2023-12-27 ENCOUNTER — Encounter: Payer: Self-pay | Admitting: Physical Therapy

## 2023-12-27 ENCOUNTER — Ambulatory Visit: Admitting: Physical Therapy

## 2023-12-27 DIAGNOSIS — M5442 Lumbago with sciatica, left side: Secondary | ICD-10-CM

## 2023-12-27 DIAGNOSIS — R278 Other lack of coordination: Secondary | ICD-10-CM

## 2023-12-27 DIAGNOSIS — M6281 Muscle weakness (generalized): Secondary | ICD-10-CM
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
## 2023-12-28 ENCOUNTER — Other Ambulatory Visit (HOSPITAL_COMMUNITY): Payer: Self-pay

## 2023-12-28 ENCOUNTER — Other Ambulatory Visit: Payer: Self-pay

## 2024-01-03 ENCOUNTER — Encounter: Admitting: Physical Therapy

## 2024-01-10 ENCOUNTER — Encounter: Admitting: Physical Therapy

## 2024-01-10 ENCOUNTER — Encounter: Payer: Self-pay | Admitting: Family Medicine

## 2024-01-10 ENCOUNTER — Ambulatory Visit: Admitting: Family Medicine

## 2024-01-10 VITALS — BP 120/76 | HR 67 | Temp 97.8°F | Ht 72.0 in | Wt 258.4 lb

## 2024-01-10 DIAGNOSIS — I10 Essential (primary) hypertension: Secondary | ICD-10-CM | POA: Diagnosis not present

## 2024-01-10 DIAGNOSIS — Z7984 Long term (current) use of oral hypoglycemic drugs: Secondary | ICD-10-CM | POA: Diagnosis not present

## 2024-01-10 DIAGNOSIS — E119 Type 2 diabetes mellitus without complications: Secondary | ICD-10-CM

## 2024-01-10 DIAGNOSIS — E785 Hyperlipidemia, unspecified: Secondary | ICD-10-CM | POA: Diagnosis not present

## 2024-01-10 LAB — HEMOGLOBIN A1C: Hgb A1c MFr Bld: 7.1 % — ABNORMAL HIGH (ref 4.6–6.5)

## 2024-01-10 LAB — GLUCOSE, RANDOM: Glucose, Bld: 134 mg/dL — ABNORMAL HIGH (ref 70–99)

## 2024-01-10 NOTE — Assessment & Plan Note (Signed)
 A1c has been improved. I will check an A1c today. Continue metformin  500 mg twice daily.
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
## 2024-01-10 NOTE — Assessment & Plan Note (Signed)
 Christian Neal's LDL cholesterol is just above goal range without the use of statins. I do not believe that a statin would be of any additional benefit for him.

## 2024-01-10 NOTE — Assessment & Plan Note (Signed)
 Blood pressure is in good control. Continue losartan  50 mg, two tablets (100 mg) daily and spironolactone  50 mg daily.

## 2024-01-11 ENCOUNTER — Ambulatory Visit: Payer: Self-pay | Admitting: Family Medicine

## 2024-01-12 ENCOUNTER — Other Ambulatory Visit (HOSPITAL_COMMUNITY): Payer: Self-pay

## 2024-02-29 ENCOUNTER — Encounter: Payer: Self-pay | Admitting: Family Medicine

## 2024-04-10 ENCOUNTER — Encounter: Payer: Self-pay | Admitting: Family Medicine

## 2024-04-10 ENCOUNTER — Ambulatory Visit (INDEPENDENT_AMBULATORY_CARE_PROVIDER_SITE_OTHER): Admitting: Family Medicine

## 2024-04-10 ENCOUNTER — Ambulatory Visit: Payer: Self-pay | Admitting: Family Medicine

## 2024-04-10 VITALS — BP 122/74 | HR 62 | Temp 97.9°F | Ht 72.0 in | Wt 259.0 lb

## 2024-04-10 DIAGNOSIS — E119 Type 2 diabetes mellitus without complications: Secondary | ICD-10-CM

## 2024-04-10 DIAGNOSIS — E785 Hyperlipidemia, unspecified: Secondary | ICD-10-CM

## 2024-04-10 DIAGNOSIS — I10 Essential (primary) hypertension: Secondary | ICD-10-CM | POA: Diagnosis not present

## 2024-04-10 DIAGNOSIS — Z7984 Long term (current) use of oral hypoglycemic drugs: Secondary | ICD-10-CM | POA: Diagnosis not present

## 2024-04-10 LAB — GLUCOSE, RANDOM: Glucose, Bld: 141 mg/dL — ABNORMAL HIGH (ref 70–99)

## 2024-04-10 LAB — HEMOGLOBIN A1C: Hgb A1c MFr Bld: 6.7 % — ABNORMAL HIGH (ref 4.6–6.5)

## 2024-04-10 NOTE — Assessment & Plan Note (Signed)
 Blood pressure is in good control. Continue losartan  50 mg, two tablets (100 mg) daily and spironolactone  50 mg daily.

## 2024-04-10 NOTE — Assessment & Plan Note (Signed)
 Christian Neal's LDL cholesterol is just above goal range without the use of statins. I do not believe that a statin would be of any additional benefit for him.
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
## 2024-04-10 NOTE — Assessment & Plan Note (Addendum)
 I will reassess an A1c today. Continue metformin  500 mg twice daily.

## 2024-04-17 ENCOUNTER — Other Ambulatory Visit (HOSPITAL_COMMUNITY): Payer: Self-pay

## 2024-04-17 ENCOUNTER — Other Ambulatory Visit: Payer: Self-pay

## 2024-04-17 ENCOUNTER — Other Ambulatory Visit: Payer: Self-pay | Admitting: Family Medicine

## 2024-04-17 DIAGNOSIS — E119 Type 2 diabetes mellitus without complications: Secondary | ICD-10-CM

## 2024-04-17 MED ORDER — METFORMIN HCL 500 MG PO TABS
500.0000 mg | ORAL_TABLET | Freq: Two times a day (BID) | ORAL | 3 refills | Status: AC
  Filled 2024-04-17: qty 60, 30d supply, fill #0
  Filled 2024-05-19: qty 60, 30d supply, fill #1

## 2024-04-26 ENCOUNTER — Other Ambulatory Visit: Payer: Self-pay | Admitting: Family Medicine

## 2024-04-26 ENCOUNTER — Other Ambulatory Visit (HOSPITAL_COMMUNITY): Payer: Self-pay

## 2024-04-26 ENCOUNTER — Other Ambulatory Visit: Payer: Self-pay

## 2024-04-26 MED ORDER — PREVIDENT 5000 SENSITIVE 1.1-5 % DT GEL
DENTAL | 11 refills | Status: AC
  Filled 2024-04-26 – 2024-05-09 (×2): qty 100, 30d supply, fill #0

## 2024-04-27 ENCOUNTER — Other Ambulatory Visit (HOSPITAL_COMMUNITY): Payer: Self-pay

## 2024-05-06 ENCOUNTER — Other Ambulatory Visit (HOSPITAL_COMMUNITY): Payer: Self-pay

## 2024-05-09 ENCOUNTER — Other Ambulatory Visit: Payer: Self-pay

## 2024-05-09 ENCOUNTER — Other Ambulatory Visit (HOSPITAL_COMMUNITY): Payer: Self-pay

## 2024-05-10 ENCOUNTER — Other Ambulatory Visit (HOSPITAL_COMMUNITY): Payer: Self-pay

## 2024-07-11 ENCOUNTER — Ambulatory Visit: Admitting: Family Medicine
